# Patient Record
Sex: Male | Born: 1965 | Race: White | Hispanic: No | Marital: Single | Smoking: Former smoker
Health system: Southern US, Community
[De-identification: ages and names within clinical notes are randomized; demographics above are authoritative.]

## PROBLEM LIST (undated history)

## (undated) DIAGNOSIS — M1990 Unspecified osteoarthritis, unspecified site: Secondary | ICD-10-CM

## (undated) DIAGNOSIS — E785 Hyperlipidemia, unspecified: Secondary | ICD-10-CM

## (undated) DIAGNOSIS — R55 Syncope and collapse: Secondary | ICD-10-CM

## (undated) DIAGNOSIS — T7840XA Allergy, unspecified, initial encounter: Secondary | ICD-10-CM

## (undated) DIAGNOSIS — I10 Essential (primary) hypertension: Secondary | ICD-10-CM

## (undated) DIAGNOSIS — J45909 Unspecified asthma, uncomplicated: Secondary | ICD-10-CM

## (undated) DIAGNOSIS — F419 Anxiety disorder, unspecified: Secondary | ICD-10-CM

## (undated) HISTORY — PX: TONSILLECTOMY: SUR1361

## (undated) HISTORY — DX: Unspecified asthma, uncomplicated: J45.909

## (undated) HISTORY — PX: WISDOM TOOTH EXTRACTION: SHX21

## (undated) HISTORY — PX: SPINE SURGERY: SHX786

## (undated) HISTORY — DX: Essential (primary) hypertension: I10

## (undated) HISTORY — DX: Anxiety disorder, unspecified: F41.9

## (undated) HISTORY — DX: Allergy, unspecified, initial encounter: T78.40XA

## (undated) HISTORY — PX: COLONOSCOPY: SHX174

## (undated) HISTORY — DX: Unspecified osteoarthritis, unspecified site: M19.90

## (undated) HISTORY — DX: Hyperlipidemia, unspecified: E78.5

## (undated) HISTORY — PX: CERVICAL SPINE SURGERY: SHX589

---

## 2000-05-16 ENCOUNTER — Ambulatory Visit (HOSPITAL_COMMUNITY): Admission: RE | Admit: 2000-05-16 | Discharge: 2000-05-16 | Payer: Self-pay | Admitting: Internal Medicine

## 2000-05-16 ENCOUNTER — Encounter: Payer: Self-pay | Admitting: Internal Medicine

## 2007-04-13 ENCOUNTER — Ambulatory Visit (HOSPITAL_COMMUNITY): Admission: RE | Admit: 2007-04-13 | Discharge: 2007-04-13 | Payer: Self-pay | Admitting: Sports Medicine

## 2010-10-25 ENCOUNTER — Ambulatory Visit (HOSPITAL_COMMUNITY)
Admission: RE | Admit: 2010-10-25 | Discharge: 2010-10-26 | Payer: Self-pay | Source: Home / Self Care | Attending: Neurosurgery | Admitting: Neurosurgery

## 2011-01-14 LAB — CBC
HCT: 44.2 % (ref 39.0–52.0)
Hemoglobin: 15.3 g/dL (ref 13.0–17.0)
MCH: 31.1 pg (ref 26.0–34.0)
MCHC: 34.6 g/dL (ref 30.0–36.0)
MCV: 89.8 fL (ref 78.0–100.0)
Platelets: 248 10*3/uL (ref 150–400)
RBC: 4.92 MIL/uL (ref 4.22–5.81)
RDW: 13.2 % (ref 11.5–15.5)
WBC: 8.4 10*3/uL (ref 4.0–10.5)

## 2011-01-14 LAB — BASIC METABOLIC PANEL
BUN: 14 mg/dL (ref 6–23)
CO2: 23 mEq/L (ref 19–32)
Calcium: 9.5 mg/dL (ref 8.4–10.5)
Chloride: 109 mEq/L (ref 96–112)
Creatinine, Ser: 1.2 mg/dL (ref 0.4–1.5)
GFR calc Af Amer: >60 mL/min (ref >60)
GFR calc non Af Amer: >60 mL/min (ref >60)
Glucose, Bld: 130 mg/dL — ABNORMAL HIGH (ref 70–99)
Potassium: 3.9 mEq/L (ref 3.5–5.1)
Sodium: 140 mEq/L (ref 135–145)

## 2011-01-14 LAB — SURGICAL PCR SCREEN
MRSA, PCR: NEGATIVE
Staphylococcus aureus: NEGATIVE

## 2012-01-26 ENCOUNTER — Ambulatory Visit (INDEPENDENT_AMBULATORY_CARE_PROVIDER_SITE_OTHER): Payer: BC Managed Care – PPO | Admitting: Physician Assistant

## 2012-01-26 VITALS — BP 134/91 | HR 106 | Temp 98.4°F | Resp 18 | Ht 70.0 in | Wt 211.0 lb

## 2012-01-26 DIAGNOSIS — J069 Acute upper respiratory infection, unspecified: Secondary | ICD-10-CM

## 2012-01-26 MED ORDER — AZITHROMYCIN 250 MG PO TABS: ORAL_TABLET | ORAL | Status: AC

## 2012-01-26 MED ORDER — HYDROCODONE-HOMATROPINE 5-1.5 MG/5ML PO SYRP: 5.0000 mL | ORAL_SOLUTION | ORAL | Status: DC | PRN

## 2012-01-26 MED ORDER — ALBUTEROL SULFATE HFA 108 (90 BASE) MCG/ACT IN AERS: 2.0000 | INHALATION_SPRAY | RESPIRATORY_TRACT | Status: DC | PRN

## 2012-01-26 NOTE — Progress Notes (Signed)
  Subjective:    Patient ID: Joel Salas, male    DOB: 08/14/1966, 46 y.o.   MRN: [id number]  HPI Joel Salas is here c/o 3 weeks of URI symptoms.  Initially head congestion and cough.  Cough persisted.  Now head congestion much worse and cough is dry and frequent.  He can't sleep at night.  Denies SOB or wheezing but does feel tight in the chest.  No fever or chills.  No GI sx.  Using Mucinex.    Former smoker   Review of Systems As above    Objective:   Physical Exam  Constitutional: He appears well-developed and well-nourished.  HENT:  Right Ear: Tympanic membrane normal.  Left Ear: Tympanic membrane normal.  Nose: Mucosal edema and rhinorrhea present.  Mouth/Throat: Posterior oropharyngeal erythema present.  Cardiovascular: Normal rate and regular rhythm.   Pulmonary/Chest: Effort normal and breath sounds normal.  Lymphadenopathy:    He has no cervical adenopathy.    PF 550 Pulse OX 97%      Assessment & Plan:  URI Paroxysmal cough  Zpack, Albuterol, Hycodan.  Has flonase.  Use mucinex DM Push fluids.  Return if sx worsen

## 2012-01-29 ENCOUNTER — Other Ambulatory Visit: Payer: Self-pay | Admitting: Physician Assistant

## 2012-01-30 ENCOUNTER — Other Ambulatory Visit: Payer: Self-pay | Admitting: Physician Assistant

## 2012-01-30 NOTE — Telephone Encounter (Signed)
Pt LM on my VM asking why his RF of Hycodan was declined. Talked with pt and told him RF had been sent in yesterday. D/W pt Mucinex, Abx and inc water intake. Called [facility] and called in Rx - they said there was a Glitch in their system and they hadn't gotten yesterday's

## 2012-03-18 ENCOUNTER — Ambulatory Visit: Payer: BC Managed Care – PPO | Admitting: Internal Medicine

## 2012-05-06 ENCOUNTER — Encounter: Payer: BC Managed Care – PPO | Admitting: Internal Medicine

## 2012-05-26 ENCOUNTER — Other Ambulatory Visit: Payer: Self-pay | Admitting: Internal Medicine

## 2012-07-01 ENCOUNTER — Encounter: Payer: Self-pay | Admitting: Internal Medicine

## 2012-07-01 ENCOUNTER — Ambulatory Visit (INDEPENDENT_AMBULATORY_CARE_PROVIDER_SITE_OTHER): Payer: BC Managed Care – PPO | Admitting: Internal Medicine

## 2012-07-01 VITALS — BP 150/100 | HR 74 | Temp 98.2°F | Resp 16 | Ht 70.0 in | Wt 213.0 lb

## 2012-07-01 DIAGNOSIS — E785 Hyperlipidemia, unspecified: Secondary | ICD-10-CM

## 2012-07-01 DIAGNOSIS — Z0000 Encounter for general adult medical examination without abnormal findings: Secondary | ICD-10-CM

## 2012-07-01 DIAGNOSIS — IMO0001 Reserved for inherently not codable concepts without codable children: Secondary | ICD-10-CM

## 2012-07-01 DIAGNOSIS — Z6830 Body mass index (BMI) 30.0-30.9, adult: Secondary | ICD-10-CM

## 2012-07-01 LAB — CBC WITH DIFFERENTIAL/PLATELET
Basophils Absolute: 0 10*3/uL (ref 0.0–0.1)
Basophils Relative: 1 % (ref 0–1)
Eosinophils Absolute: 0.1 10*3/uL (ref 0.0–0.7)
Eosinophils Relative: 1 % (ref 0–5)
HCT: 44.3 % (ref 39.0–52.0)
Hemoglobin: 16.1 g/dL (ref 13.0–17.0)
Lymphocytes Relative: 40 % (ref 12–46)
Lymphs Abs: 3.1 10*3/uL (ref 0.7–4.0)
MCH: 30.8 pg (ref 26.0–34.0)
MCHC: 36.3 g/dL — ABNORMAL HIGH (ref 30.0–36.0)
MCV: 84.7 fL (ref 78.0–100.0)
Monocytes Absolute: 0.6 10*3/uL (ref 0.1–1.0)
Monocytes Relative: 7 % (ref 3–12)
Neutro Abs: 4 10*3/uL (ref 1.7–7.7)
Neutrophils Relative %: 51 % (ref 43–77)
Platelets: 239 10*3/uL (ref 150–400)
RBC: 5.23 MIL/uL (ref 4.22–5.81)
RDW: 13.2 % (ref 11.5–15.5)
WBC: 7.7 10*3/uL (ref 4.0–10.5)

## 2012-07-01 MED ORDER — FLUTICASONE PROPIONATE 50 MCG/ACT NA SUSP: 1.0000 | Freq: Every day | NASAL | Status: DC

## 2012-07-01 MED ORDER — SIMVASTATIN 40 MG PO TABS: 40.0000 mg | ORAL_TABLET | Freq: Every evening | ORAL | Status: DC

## 2012-07-01 MED ORDER — ALBUTEROL SULFATE HFA 108 (90 BASE) MCG/ACT IN AERS: 2.0000 | INHALATION_SPRAY | Freq: Four times a day (QID) | RESPIRATORY_TRACT | Status: DC | PRN

## 2012-07-01 NOTE — Progress Notes (Signed)
  Subjective:    Patient ID: Joel Salas, male    DOB: [birth year], 46 y.o.   MRN: [id number]  HPI    Review of Systems  Constitutional: Positive for fatigue.  HENT: Positive for dental problem, postnasal drip and tinnitus.   Respiratory: Positive for shortness of breath and wheezing.   Musculoskeletal: Positive for back pain.  Neurological: Positive for dizziness and light-headedness.  Hematological: Negative.   Psychiatric/Behavioral: Negative.        Objective:   Physical Exam        Assessment & Plan:

## 2012-07-02 ENCOUNTER — Other Ambulatory Visit: Payer: Self-pay | Admitting: Physician Assistant

## 2012-07-02 LAB — COMPREHENSIVE METABOLIC PANEL
ALT: 29 U/L (ref 0–53)
AST: 21 U/L (ref 0–37)
Albumin: 4.7 g/dL (ref 3.5–5.2)
Alkaline Phosphatase: 40 U/L (ref 39–117)
BUN: 15 mg/dL (ref 6–23)
CO2: 23 mEq/L (ref 19–32)
Calcium: 9.5 mg/dL (ref 8.4–10.5)
Chloride: 110 mEq/L (ref 96–112)
Creat: 1.13 mg/dL (ref 0.50–1.35)
Glucose, Bld: 99 mg/dL (ref 70–99)
Potassium: 4.1 mEq/L (ref 3.5–5.3)
Sodium: 142 mEq/L (ref 135–145)
Total Bilirubin: 0.9 mg/dL (ref 0.3–1.2)
Total Protein: 6.8 g/dL (ref 6.0–8.3)

## 2012-07-02 LAB — LIPID PANEL
Cholesterol: 167 mg/dL (ref 0–200)
HDL: 34 mg/dL — ABNORMAL LOW (ref >39)
LDL Cholesterol: 121 mg/dL — ABNORMAL HIGH (ref 0–99)
Total CHOL/HDL Ratio: 4.9 Ratio
Triglycerides: 62 mg/dL (ref <150)
VLDL: 12 mg/dL (ref 0–40)

## 2012-07-02 NOTE — Progress Notes (Signed)
Subjective:    Patient ID: Joel Salas, male    DOB: 01-06-1966, 46 y.o.   MRN: [id number]  HPICPE Patient Active Problem List  Diagnosis  . Hyperlipidemia  . Elevated BP  . 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 His career/Hopes to resume riding this year for fun Immunizations up to date with flu shot unavailable today   Had a recent episode of bronchitis which led to wheezing/albuterol was helpful and he would like a refill Past history has no asthma but a few episodes of disease associated bronchospasm He has a history of allergic rhinitis Review of Systems 13 point review of systems negative    Objective:   Physical Exam Blood pressure 150/100 but record of home blood pressures reviewed and all are below 135/85 He does this several times a week HEENT clear Heart regular without murmur Lungs clear Abdomen benign Extremities clear Neurological intact Skin clear  Results for orders placed in visit on 07/01/12  CBC WITH DIFFERENTIAL      Component Value Range   WBC 7.7  4.0 - 10.5 K/uL   RBC 5.23  4.22 - 5.81 MIL/uL   Hemoglobin 16.1  13.0 - 17.0 g/dL   HCT 40.9  81.1 - 91.4 %   MCV 84.7  78.0 - 100.0 fL   MCH 30.8  26.0 - 34.0 pg   MCHC 36.3 (*) 30.0 - 36.0 g/dL   RDW 78.2  95.6 - 21.3 %   Platelets 239  150 - 400 K/uL   Neutrophils Relative 51  43 - 77 %   Neutro Abs 4.0  1.7 - 7.7 K/uL   Lymphocytes Relative 40  12 - 46 %   Lymphs Abs 3.1  0.7 - 4.0 K/uL   Monocytes Relative 7  3 - 12 %   Monocytes Absolute 0.6  0.1 - 1.0 K/uL   Eosinophils Relative 1  0 - 5 %   Eosinophils Absolute 0.1  0.0 - 0.7 K/uL   Basophils Relative 1  0 - 1 %   Basophils Absolute 0.0  0.0 - 0.1 K/uL   Smear Review Criteria  for review not met    LIPID PANEL      Component Value Range   Cholesterol 167  0 - 200 mg/dL   Triglycerides 62  <086 mg/dL   HDL 34 (*) >57 mg/dL   Total CHOL/HDL Ratio 4.9     VLDL 12  0 - 40 mg/dL   LDL Cholesterol 846 (*) 0 - 99 mg/dL  COMPREHENSIVE METABOLIC PANEL      Component Value Range   Sodium 142  135 - 145 mEq/L   Potassium 4.1  3.5 - 5.3 mEq/L   Chloride 110  96 - 112 mEq/L   CO2 23  19 - 32 mEq/L   Glucose, Bld 99  70 - 99 mg/dL   BUN 15  6 - 23 mg/dL   Creat 9.62  9.52 - 8.41 mg/dL   Total Bilirubin 0.9  0.3 - 1.2 mg/dL   Alkaline Phosphatase 40  39 - 117 U/L   AST 21  0 - 37 U/L   ALT 29  0 - 53 U/L   Total Protein 6.8  6.0 - 8.3 g/dL   Albumin  4.7  3.5 - 5.2 g/dL   Calcium 9.5  8.4 - 81.1 mg/dL        Assessment & Plan:  Annual physical examination 1. Hyperlipidemia  CBC with Differential, Lipid panel, Comprehensive metabolic panel  2. Elevated BP    3. BMI 30.0-30.9,adult    4   Allergic rhinitis 5.  History of respiratory illness induced bronchospasm  Meds ordered this encounter  Medications  . simvastatin (ZOCOR) 40 MG tablet    Sig: Take 1 tablet (40 mg total) by mouth every evening.    Dispense:  90 tablet    Refill:  3  . fluticasone (FLONASE) 50 MCG/ACT nasal spray    Sig: Place 1 spray into the nose daily.    Dispense:  16 g    Refill:  11  . albuterol (PROVENTIL HFA;VENTOLIN HFA) 108 (90 BASE) MCG/ACT inhaler    Sig: Inhale 2 puffs into the lungs every 6 (six) hours as needed for wheezing.    Dispense:  1 Inhaler    Refill:  5    Discussed weight loss

## 2012-07-02 NOTE — Telephone Encounter (Signed)
?   Ok x 3, just seen yesterday for wheezing, but note has not been completed yet.

## 2012-07-03 ENCOUNTER — Encounter: Payer: Self-pay | Admitting: Internal Medicine

## 2013-08-28 ENCOUNTER — Other Ambulatory Visit: Payer: Self-pay | Admitting: Physician Assistant

## 2013-09-29 ENCOUNTER — Ambulatory Visit (INDEPENDENT_AMBULATORY_CARE_PROVIDER_SITE_OTHER): Payer: BC Managed Care – PPO | Admitting: Internal Medicine

## 2013-09-29 ENCOUNTER — Encounter: Payer: Self-pay | Admitting: Internal Medicine

## 2013-09-29 VITALS — BP 150/98 | HR 75 | Temp 98.5°F | Resp 16 | Ht 69.5 in | Wt 202.0 lb

## 2013-09-29 DIAGNOSIS — IMO0001 Reserved for inherently not codable concepts without codable children: Secondary | ICD-10-CM

## 2013-09-29 DIAGNOSIS — Z0000 Encounter for general adult medical examination without abnormal findings: Secondary | ICD-10-CM

## 2013-09-29 DIAGNOSIS — Z9889 Other specified postprocedural states: Secondary | ICD-10-CM

## 2013-09-29 DIAGNOSIS — Z23 Encounter for immunization: Secondary | ICD-10-CM

## 2013-09-29 DIAGNOSIS — J309 Allergic rhinitis, unspecified: Secondary | ICD-10-CM

## 2013-09-29 DIAGNOSIS — J45909 Unspecified asthma, uncomplicated: Secondary | ICD-10-CM

## 2013-09-29 DIAGNOSIS — M549 Dorsalgia, unspecified: Secondary | ICD-10-CM

## 2013-09-29 DIAGNOSIS — E785 Hyperlipidemia, unspecified: Secondary | ICD-10-CM

## 2013-09-29 DIAGNOSIS — M25551 Pain in right hip: Secondary | ICD-10-CM

## 2013-09-29 DIAGNOSIS — M542 Cervicalgia: Secondary | ICD-10-CM

## 2013-09-29 LAB — CBC WITH DIFFERENTIAL/PLATELET
Basophils Absolute: 0 10*3/uL (ref 0.0–0.1)
Basophils Relative: 1 % (ref 0–1)
Eosinophils Absolute: 0.2 10*3/uL (ref 0.0–0.7)
Eosinophils Relative: 2 % (ref 0–5)
HCT: 45 % (ref 39.0–52.0)
Hemoglobin: 15.9 g/dL (ref 13.0–17.0)
Lymphocytes Relative: 45 % (ref 12–46)
Lymphs Abs: 3.5 10*3/uL (ref 0.7–4.0)
MCH: 30.5 pg (ref 26.0–34.0)
MCHC: 35.3 g/dL (ref 30.0–36.0)
MCV: 86.2 fL (ref 78.0–100.0)
Monocytes Absolute: 0.6 10*3/uL (ref 0.1–1.0)
Monocytes Relative: 8 % (ref 3–12)
Neutro Abs: 3.4 10*3/uL (ref 1.7–7.7)
Neutrophils Relative %: 44 % (ref 43–77)
Platelets: 251 10*3/uL (ref 150–400)
RBC: 5.22 MIL/uL (ref 4.22–5.81)
RDW: 14 % (ref 11.5–15.5)
WBC: 7.8 10*3/uL (ref 4.0–10.5)

## 2013-09-29 LAB — POCT URINALYSIS DIPSTICK
Bilirubin, UA: NEGATIVE
Blood, UA: NEGATIVE
Glucose, UA: NEGATIVE
Ketones, UA: NEGATIVE
Leukocytes, UA: NEGATIVE
Nitrite, UA: NEGATIVE
Protein, UA: NEGATIVE
Spec Grav, UA: <=1.005
Urobilinogen, UA: 0.2
pH, UA: 5.5

## 2013-09-29 LAB — COMPREHENSIVE METABOLIC PANEL
ALT: 39 U/L (ref 0–53)
AST: 31 U/L (ref 0–37)
Albumin: 4.6 g/dL (ref 3.5–5.2)
Alkaline Phosphatase: 48 U/L (ref 39–117)
BUN: 22 mg/dL (ref 6–23)
CO2: 23 mEq/L (ref 19–32)
Calcium: 9.5 mg/dL (ref 8.4–10.5)
Chloride: 110 mEq/L (ref 96–112)
Creat: 1.27 mg/dL (ref 0.50–1.35)
Glucose, Bld: 107 mg/dL — ABNORMAL HIGH (ref 70–99)
Potassium: 4.2 mEq/L (ref 3.5–5.3)
Sodium: 141 mEq/L (ref 135–145)
Total Bilirubin: 0.6 mg/dL (ref 0.3–1.2)
Total Protein: 6.7 g/dL (ref 6.0–8.3)

## 2013-09-29 LAB — LIPID PANEL
Cholesterol: 176 mg/dL (ref 0–200)
HDL: 34 mg/dL — ABNORMAL LOW (ref >39)
LDL Cholesterol: 119 mg/dL — ABNORMAL HIGH (ref 0–99)
Total CHOL/HDL Ratio: 5.2 Ratio
Triglycerides: 116 mg/dL (ref <150)
VLDL: 23 mg/dL (ref 0–40)

## 2013-09-29 LAB — PSA: PSA: 1.07 ng/mL (ref <=4.00)

## 2013-09-29 MED ORDER — FLUTICASONE PROPIONATE 50 MCG/ACT NA SUSP: 1.0000 | Freq: Every day | NASAL | Status: DC

## 2013-09-29 MED ORDER — ALBUTEROL SULFATE HFA 108 (90 BASE) MCG/ACT IN AERS: INHALATION_SPRAY | RESPIRATORY_TRACT | Status: DC

## 2013-09-29 MED ORDER — SIMVASTATIN 40 MG PO TABS: 40.0000 mg | ORAL_TABLET | Freq: Every evening | ORAL | Status: DC

## 2013-09-29 NOTE — Patient Instructions (Signed)
Referral to Dr Ellamae Sia for PT Eval and Treat for chronic low back spasm (s/p neck surgery 3 y ago)

## 2013-09-29 NOTE — Progress Notes (Signed)
Subjective:    Patient ID: Joel Salas, male    DOB: 1965-11-22, 47 y.o.   MRN: [id number]  HPIcpe Patient Active Problem List   Diagnosis Date Noted  . Hyperlipidemia--no chg wt/diet 07/01/2012  . Elevated BP---dx htn '08 treated til hypotens 2013 off since with good home BP 07/01/2012  . BMI 30.0-30.9,adult 07/01/2012    -  AR w/ seasonal RAD  Current outpatient prescriptions:fluticasone (FLONASE) 50 MCG/ACT nasal spray, Place 1 spray into the nose daily. simvastatin (ZOCOR) 40 MG tablet, Take 1 tablet (40 mg total) by mouth every evening., Disp: 90 tablet, Rfl: 3;   VENTOLIN HFA 108 (90 BASE) MCG/ACT inhaler, INHALE 2 PUFFS INTO THE LUNGS EVERY 4 HOURS AS NEEDED FOR WHEEZING, Disp: 1 Inhaler, Rfl: 2///seasonal  Doing well except aches and pains Low back acts up r side w/ activ---can no longer bike Im utd  FH-arth/BP/choles rel to diet  SH-welder/own business  [place] fam  Review of Systems  Constitutional: Negative.   HENT: Negative.   Eyes: Negative.   Respiratory: Positive for wheezing.   Cardiovascular: Negative.   Gastrointestinal: Positive for nausea.  Endocrine: Negative.   Genitourinary: Negative.  Negative for frequency.  Musculoskeletal: Positive for arthralgias and back pain.  Allergic/Immunologic: Positive for environmental allergies.  Neurological: Negative.  Negative for headaches.  Hematological: Negative.   Psychiatric/Behavioral: Negative.  Negative for behavioral problems, confusion, sleep disturbance, dysphoric mood and decreased concentration. The patient is not nervous/anxious.        Objective:   Physical Exam  Constitutional: He is oriented to person, place, and time. He appears well-developed and well-nourished.  HENT:  Head: Normocephalic.  Right Ear: External ear normal.  Left Ear: External ear normal.  Nose: Nose normal.  Mouth/Throat: Oropharynx is clear and moist.  Tms and canals clear  Eyes: Conjunctivae and EOM are  normal. Pupils are equal, round, and reactive to light.  Neck: Normal range of motion. Neck supple. No thyromegaly present.  Good rom post surg  Cardiovascular: Normal rate, regular rhythm, normal heart sounds and intact distal pulses.   No murmur heard. Pulmonary/Chest: Effort normal and breath sounds normal. No respiratory distress. He has no wheezes. He has no rales.  Abdominal: Soft. Bowel sounds are normal. He exhibits no distension and no mass. There is no tenderness. There is no rebound and no guarding.  No hepatosplenomegaly  Musculoskeletal: Normal range of motion. He exhibits tenderness. He exhibits no edema.  Tender R lumbar muscles w/ neg SLR 90  Lymphadenopathy:    He has no cervical adenopathy.  Neurological: He is alert and oriented to person, place, and time. He has normal reflexes. No cranial nerve deficit. He exhibits normal muscle tone. Coordination normal.  Skin: Skin is warm and dry. No rash noted.  Psychiatric: He has a normal mood and affect. His behavior is normal. Judgment and thought content normal.          Assessment & Plan:  Need for prophylactic vaccination and inoculation against influenza - Plan: Flu Vaccine QUAD 36+ mos IM  Routine general medical examination at a health care facility - Plan: POCT urinalysis dipstick, CBC with Differential, Comprehensive metabolic panel, Lipid panel, PSA  Back pain Ref J O'Halloran for pt Hip pain, right  Elevated BP-off meds due to low BP///to follow home BP and call if >135/85  AR (allergic rhinitis) - Plan: fluticasone (FLONASE) 50 MCG/ACT nasal spray  RAD (reactive airway disease) - Plan: albuterol (VENTOLIN HFA) 108 (90 BASE) MCG/ACT  inhaler  Other and unspecified hyperlipidemia - Plan: simvastatin (ZOCOR) 40 MG tablet  H/O cervical spine surgery-2011 w/ occas Neck pain---cont flex ex  Out of zocor 1 month so labs will assess needs  Meds ordered this encounter  Medications  . fluticasone (FLONASE) 50  MCG/ACT nasal spray    Sig: Place 1 spray into both nostrils daily.    Dispense:  16 g    Refill:  11  . albuterol (VENTOLIN HFA) 108 (90 BASE) MCG/ACT inhaler    Sig: INHALE 2 PUFFS INTO THE LUNGS EVERY 4 HOURS AS NEEDED FOR WHEEZING    Dispense:  1 Inhaler    Refill:  5  . simvastatin (ZOCOR) 40 MG tablet    Sig: Take 1 tablet (40 mg total) by mouth every evening.    Dispense:  90 tablet    Refill:  3       May change if labs dictate

## 2013-09-30 ENCOUNTER — Encounter: Payer: Self-pay | Admitting: Internal Medicine

## 2014-01-28 ENCOUNTER — Ambulatory Visit (INDEPENDENT_AMBULATORY_CARE_PROVIDER_SITE_OTHER): Payer: BC Managed Care – PPO | Admitting: Internal Medicine

## 2014-01-28 VITALS — BP 160/92 | HR 88 | Temp 98.0°F | Resp 16 | Ht 70.0 in | Wt 219.6 lb

## 2014-01-28 DIAGNOSIS — G4700 Insomnia, unspecified: Secondary | ICD-10-CM

## 2014-01-28 DIAGNOSIS — F411 Generalized anxiety disorder: Secondary | ICD-10-CM

## 2014-01-28 DIAGNOSIS — R110 Nausea: Secondary | ICD-10-CM

## 2014-01-28 MED ORDER — PAROXETINE HCL 20 MG PO TABS: 20.0000 mg | ORAL_TABLET | Freq: Every day | ORAL | Status: DC

## 2014-01-28 MED ORDER — ALPRAZOLAM 0.5 MG PO TABS: 0.5000 mg | ORAL_TABLET | Freq: Two times a day (BID) | ORAL | Status: DC | PRN

## 2014-01-28 NOTE — Progress Notes (Signed)
Subjective:    Patient ID: Joel Salas, male    DOB: [birth year], 48 y.o.   MRN: [id number]  HPI This chart was scribed for [facility], by Lovena Le Stclair Szymborski, Scribe. This patient was seen in room 5 and the patient's care was started at 11:43 AM.  HPI Comments: Joel Salas is a 48 y.o. male who presents to the [facility] for nausea and anxiety. He reports has had ongoing nausea and anxiety flare ups which seem to be associated with one another. He reports these flare ups occur sometimes sporadically and occur suddenly.  He states while at the dinner table, began having paraesthesias in his finger tips and having a mild panic attack. He notices that his nausea sometimes flares up just before he gets a panic attack. He reports these episodes have occurred while watching a basketball game in the stands, at dinner eating a hot dog and drinking beer on [place]. He denies any sx of heartburn.   He also reports having increased stress lately with family members who are ill. He states his brother is in renal failure as well his mother and sister who are ill. He states that financially he is stressed right now. He states often will wake up during the night 3 or 4 times per week and that it takes up to an hour to fall back asleep.   Patient Active Problem List   Diagnosis Date Noted  . H/O cervical spine surgery-2011 09/29/2013  . Hyperlipidemia 07/01/2012  . Elevated BP 07/01/2012  . BMI 30.0-30.9,adult 07/01/2012     No Known Allergies   Review of Systems  Constitutional: Negative for fever and chills.  Respiratory: Negative for cough and shortness of breath.   Cardiovascular: Negative for chest pain.  Gastrointestinal: Negative for abdominal pain.  Musculoskeletal: Negative for back pain.  Psychiatric/Behavioral: The patient is nervous/anxious.       Objective:   Physical Exam  Nursing note and vitals reviewed. Constitutional: He is oriented  to person, place, and time. He appears well-developed and well-nourished. No distress.  HENT:  Head: Normocephalic and atraumatic.  Eyes: Conjunctivae are normal. Right eye exhibits no discharge. Left eye exhibits no discharge.  Neck: Normal range of motion. No thyromegaly present.  Cardiovascular: Normal rate.   Pulmonary/Chest: Effort normal. No respiratory distress.  Musculoskeletal: Normal range of motion. He exhibits no edema.  Neurological: He is alert and oriented to person, place, and time.  Skin: Skin is warm and dry.  Psychiatric: He has a normal mood and affect. Thought content normal.   Triage Vitals: BP 160/92  Pulse 88  Temp(Src) 98 F (36.7 C) (Oral)  Resp 16  Ht $R'5\' 10"'mT$  (1.778 m)  Wt 219 lb 9.6 oz (99.61 kg)  BMI 31.51 kg/m2  SpO2 99%  Results for orders placed in visit on 09/29/13  CBC WITH DIFFERENTIAL      Result Value Ref Range   WBC 7.8  4.0 - 10.5 K/uL   RBC 5.22  4.22 - 5.81 MIL/uL   Hemoglobin 15.9  13.0 - 17.0 g/dL   HCT 45.0  39.0 - 52.0 %   MCV 86.2  78.0 - 100.0 fL   MCH 30.5  26.0 - 34.0 pg   MCHC 35.3  30.0 - 36.0 g/dL   RDW 14.0  11.5 - 15.5 %   Platelets 251  150 - 400 K/uL   Neutrophils Relative % 44  43 - 77 %  Neutro Abs 3.4  1.7 - 7.7 K/uL   Lymphocytes Relative 45  12 - 46 %   Lymphs Abs 3.5  0.7 - 4.0 K/uL   Monocytes Relative 8  3 - 12 %   Monocytes Absolute 0.6  0.1 - 1.0 K/uL   Eosinophils Relative 2  0 - 5 %   Eosinophils Absolute 0.2  0.0 - 0.7 K/uL   Basophils Relative 1  0 - 1 %   Basophils Absolute 0.0  0.0 - 0.1 K/uL   Smear Review Criteria for review not met    COMPREHENSIVE METABOLIC PANEL      Result Value Ref Range   Sodium 141  135 - 145 mEq/L   Potassium 4.2  3.5 - 5.3 mEq/L   Chloride 110  96 - 112 mEq/L   CO2 23  19 - 32 mEq/L   Glucose, Bld 107 (*) 70 - 99 mg/dL   BUN 22  6 - 23 mg/dL   Creat 1.27  0.50 - 1.35 mg/dL   Total Bilirubin 0.6  0.3 - 1.2 mg/dL   Alkaline Phosphatase 48  39 - 117 U/L   AST 31  0  - 37 U/L   ALT 39  0 - 53 U/L   Total Protein 6.7  6.0 - 8.3 g/dL   Albumin 4.6  3.5 - 5.2 g/dL   Calcium 9.5  8.4 - 10.5 mg/dL  LIPID PANEL      Result Value Ref Range   Cholesterol 176  0 - 200 mg/dL   Triglycerides 116  <150 mg/dL   HDL 34 (*) >39 mg/dL   Total CHOL/HDL Ratio 5.2     VLDL 23  0 - 40 mg/dL   LDL Cholesterol 119 (*) 0 - 99 mg/dL  PSA      Result Value Ref Range   PSA 1.07  <=4.00 ng/mL  POCT URINALYSIS DIPSTICK      Result Value Ref Range   Color, UA lt yellow     Clarity, UA clear     Glucose, UA neg     Bilirubin, UA neg     Ketones, UA neg     Spec Grav, UA <=1.005     Blood, UA neg     pH, UA 5.5     Protein, UA neg     Urobilinogen, UA 0.2     Nitrite, UA neg     Leukocytes, UA Negative        Assessment & Plan:  DIAGNOSTIC STUDIES: Oxygen Saturation is 99% on room air, normal by my interpretation.    COORDINATION OF CARE: At 1140 AM Discussed treatment plan with patient which includes anxiety medicine. Patient agrees.   I personally performed the services described in this documentation, which was scribed in my presence. The recorded information has been reviewed and is accurate. I have completed the patient encounter in its entirety as documented by the scribe, with editing by me where necessary. Robert P. Laney Pastor, M.D.  Nausea alone  Generalized anxiety disorder  Insomnia, unspecified  Meds ordered this encounter  Medications  . ALPRAZolam (XANAX) 0.5 MG tablet    Sig: Take 1 tablet (0.5 mg total) by mouth 2 (two) times daily as needed for anxiety.    Dispense:  30 tablet    Refill:  1  . PARoxetine (PAXIL) 20 MG tablet    Sig: Take 1 tablet (20 mg total) by mouth daily.    Dispense:  30 tablet  Refill:  1   F/u 1-2 mos

## 2014-02-04 NOTE — Progress Notes (Signed)
Appointment made for 03/02/14 @ 3:15pm.

## 2014-03-02 ENCOUNTER — Encounter: Payer: Self-pay | Admitting: Internal Medicine

## 2014-03-02 ENCOUNTER — Ambulatory Visit (INDEPENDENT_AMBULATORY_CARE_PROVIDER_SITE_OTHER): Payer: BC Managed Care – PPO | Admitting: Internal Medicine

## 2014-03-02 VITALS — BP 120/79 | HR 70 | Temp 97.0°F | Resp 18 | Ht 70.0 in | Wt 211.0 lb

## 2014-03-02 DIAGNOSIS — IMO0001 Reserved for inherently not codable concepts without codable children: Secondary | ICD-10-CM

## 2014-03-02 DIAGNOSIS — G4700 Insomnia, unspecified: Secondary | ICD-10-CM

## 2014-03-02 DIAGNOSIS — R030 Elevated blood-pressure reading, without diagnosis of hypertension: Secondary | ICD-10-CM

## 2014-03-02 DIAGNOSIS — F411 Generalized anxiety disorder: Secondary | ICD-10-CM

## 2014-03-02 MED ORDER — PAROXETINE HCL 20 MG PO TABS: 20.0000 mg | ORAL_TABLET | Freq: Every day | ORAL | Status: DC

## 2014-03-02 NOTE — Progress Notes (Signed)
Subjective:    Patient ID: Joel Salas, male    DOB: 03-16-1966, 48 y.o.   MRN: [id number] This chart was scribed for Tami Lin, MD by Anastasia Pall, ED Scribe. This patient was seen in room 21 and the patient's care was started at 3:32 PM.  Chief Complaint  Patient presents with  . Follow-up    medicine xanax paxil   HPI Joel Salas is a 48 y.o. male who presents to the [facility] for a follow up of Xanax and Paxil.   He reports responding well to his medications. He denies any side effects with taking Paxil. He reports a few episodes where he needed to take his Xanax, but reports relief after taking his dose. He reports being healthy, and denies any symptoms.  ANX desolved and BP went down!  Patient Active Problem List   Diagnosis Date Noted  . H/O cervical spine surgery-2011 09/29/2013  . Hyperlipidemia 07/01/2012  . Elevated BP 07/01/2012  . BMI 30.0-30.9,adult 07/01/2012   Prior to Admission medications   Medication Sig Start Date End Date Taking? Authorizing Provider  albuterol (VENTOLIN HFA) 108 (90 BASE) MCG/ACT inhaler INHALE 2 PUFFS INTO THE LUNGS EVERY 4 HOURS AS NEEDED FOR WHEEZING 09/29/13  Yes Leandrew Koyanagi, MD  ALPRAZolam Duanne Moron) 0.5 MG tablet Take 1 tablet (0.5 mg total) by mouth 2 (two) times daily as needed for anxiety. 01/28/14  Yes Leandrew Koyanagi, MD  fluticasone (FLONASE) 50 MCG/ACT nasal spray Place 1 spray into both nostrils daily. 09/29/13  Yes Leandrew Koyanagi, MD  PARoxetine (PAXIL) 20 MG tablet Take 1 tablet (20 mg total) by mouth daily. 01/28/14  Yes Leandrew Koyanagi, MD  simvastatin (ZOCOR) 40 MG tablet Take 1 tablet (40 mg total) by mouth every evening. 09/29/13  Yes Leandrew Koyanagi, MD  HYDROcodone-homatropine [facility]) 5-1.5 MG/5ML syrup TAKE 1 TEASPOONFUL BY MOUTH EVERY 4 HOURS AS NEEDED FOR COUGH 02/20/61   Beatriz Chancellor, PA-C   Review of Systems  Constitutional: Negative for fever.  HENT: Negative for congestion.     Respiratory: Negative for cough.   Cardiovascular: Negative for chest pain and palpitations.  Gastrointestinal: Negative for nausea, vomiting and abdominal pain.  Musculoskeletal: Negative for arthralgias and myalgias.  Skin: Negative for wound.  Psychiatric/Behavioral: Negative for dysphoric mood. The patient is not nervous/anxious.       Objective:   Physical Exam  Nursing note and vitals reviewed. Constitutional: He is oriented to person, place, and time. He appears well-developed and well-nourished. No distress.  HENT:  Head: Normocephalic and atraumatic.  Eyes: EOM are normal.  Neck: Neck supple.  Cardiovascular: Normal rate.   Pulmonary/Chest: Effort normal. No respiratory distress.  Musculoskeletal: Normal range of motion.  Neurological: He is alert and oriented to person, place, and time.  Skin: Skin is warm and dry.  Psychiatric: He has a normal mood and affect. His behavior is normal. His mood appears not anxious.   BP 120/79  Pulse 70  Temp(Src) 97 F (36.1 C) (Oral)  Resp 18  Ht 5\' 10"  (1.778 m)  Wt 211 lb (95.709 kg)  BMI 30.28 kg/m2  SpO2 96%     Assessment & Plan:  Insom/GAD Meds ordered this encounter  Medications  . PARoxetine (PAXIL) 20 MG tablet    Sig: Take 1 tablet (20 mg total) by mouth daily.    Dispense:  90 tablet    Refill:  3   F/u 1 yr --call fro ref if  needed   I have completed the patient encounter in its entirety as documented by the scribe, with editing by me where necessary. Eastyn Dattilo P. Laney Pastor, M.D.

## 2014-03-18 ENCOUNTER — Other Ambulatory Visit: Payer: Self-pay | Admitting: Internal Medicine

## 2014-03-21 NOTE — Telephone Encounter (Signed)
Called in.

## 2014-05-25 ENCOUNTER — Other Ambulatory Visit: Payer: Self-pay | Admitting: Internal Medicine

## 2014-05-26 NOTE — Telephone Encounter (Signed)
Faxed

## 2014-05-27 ENCOUNTER — Telehealth: Payer: Self-pay

## 2014-05-27 NOTE — Telephone Encounter (Signed)
Pt needs a refill of xanax please!

## 2014-05-30 NOTE — Telephone Encounter (Signed)
Notified pt in case he wasn't aware that xanax had been faxed to pharm the day before his call. He was not aware, but will check w/pharm and call back if they do not have it.

## 2014-06-27 ENCOUNTER — Other Ambulatory Visit: Payer: Self-pay | Admitting: Physician Assistant

## 2014-06-28 NOTE — Telephone Encounter (Signed)
Faxed

## 2014-07-12 ENCOUNTER — Telehealth: Payer: Self-pay

## 2014-07-12 NOTE — Telephone Encounter (Signed)
Advised pt to schedule an appt or come to the walk in clinic to have bloodwork/follow up to decide if discontinuing cholesterol medication is appropriate

## 2014-07-12 NOTE — Telephone Encounter (Signed)
Pt of Dr. Laney Pastor states that he was told that if he lost 15-20 ibs he could go off of his cholesterol medication. Pt states that he has lost the weight and would like to know if Dr. Laney Pastor would give him the ok to discontinue his medication. Please advise pt.

## 2014-08-03 ENCOUNTER — Ambulatory Visit (INDEPENDENT_AMBULATORY_CARE_PROVIDER_SITE_OTHER): Payer: BC Managed Care – PPO | Admitting: Internal Medicine

## 2014-08-03 ENCOUNTER — Encounter: Payer: Self-pay | Admitting: Internal Medicine

## 2014-08-03 VITALS — BP 124/78 | HR 66 | Temp 98.5°F | Resp 16 | Ht 70.0 in | Wt 198.0 lb

## 2014-08-03 DIAGNOSIS — E785 Hyperlipidemia, unspecified: Secondary | ICD-10-CM

## 2014-08-03 DIAGNOSIS — Z23 Encounter for immunization: Secondary | ICD-10-CM

## 2014-08-03 LAB — LIPID PANEL
Cholesterol: 186 mg/dL (ref 0–200)
HDL: 46 mg/dL (ref >39)
LDL Cholesterol: 115 mg/dL — ABNORMAL HIGH (ref 0–99)
Total CHOL/HDL Ratio: 4 Ratio
Triglycerides: 123 mg/dL (ref <150)
VLDL: 25 mg/dL (ref 0–40)

## 2014-08-03 MED ORDER — ALPRAZOLAM 0.5 MG PO TABS: ORAL_TABLET | ORAL | Status: DC

## 2014-08-03 NOTE — Progress Notes (Signed)
Subjective:  This chart was scribed for Tami Lin, MD by Donato Schultz, Medical Scribe. This patient was seen in Room 24 and the patient's care was started at 1:46 PM.   Patient ID: Joel Salas, male    DOB: [birth year], 48 y.o.   MRN: [id number]  HPI HPI Comments: Joel Salas is a 48 y.o. male with a history of hyperlipidemia who presents to the [facility] to check his cholesterol.  He is taking simvastatin 40mg .  His anxiety is under control but he needs a refill of his Xanax and Paxil.  He [place] the woods daily.    Patient Active Problem List   Diagnosis Date Noted  . H/O cervical spine surgery-2011 09/29/2013  . Hyperlipidemia 07/01/2012  . Elevated BP 07/01/2012  . BMI 30.0-30.9,adult 07/01/2012   Past Medical History  Diagnosis Date  . Anxiety   . Allergy   . Arthritis    Past Surgical History  Procedure Laterality Date  . Spine surgery     No Known Allergies Prior to Admission medications   Medication Sig Start Date End Date Taking? Authorizing Provider  albuterol (VENTOLIN HFA) 108 (90 BASE) MCG/ACT inhaler INHALE 2 PUFFS INTO THE LUNGS EVERY 4 HOURS AS NEEDED FOR WHEEZING 09/29/13   Leandrew Koyanagi, MD  ALPRAZolam Duanne Moron) 0.5 MG tablet TAKE 1 TABLET BY MOUTH TWICE DAILY AS NEEDED FOR ANXIETY 06/27/14   Leandrew Koyanagi, MD  fluticasone (FLONASE) 50 MCG/ACT nasal spray Place 1 spray into both nostrils daily. 09/29/13   Leandrew Koyanagi, MD  HYDROcodone-homatropine [facility]) 5-1.5 MG/5ML syrup TAKE 1 TEASPOONFUL BY MOUTH EVERY 4 HOURS AS NEEDED FOR COUGH 2/72/53   Beatriz Chancellor, PA-C  PARoxetine (PAXIL) 20 MG tablet Take 1 tablet (20 mg total) by mouth daily. 03/02/14   Leandrew Koyanagi, MD  simvastatin (ZOCOR) 40 MG tablet Take 1 tablet (40 mg total) by mouth every evening. 09/29/13   Leandrew Koyanagi, MD   History   Social History  . Marital Status: Single    Spouse Name: N/A    Number of Children: N/A    . Years of Education: N/A   Occupational History  . Equities trader    Social History Main Topics  . Smoking status: Former Research scientist (life sciences)  . Smokeless tobacco: Not on file  . Alcohol Use: Yes     Comment: 12-15  . Drug Use: No  . Sexual Activity: Not on file   Other Topics Concern  . Not on file   Social History Narrative   Single. Education: college.    Review of Systems noncontr  Objective:  Physical Exam  Nursing note and vitals reviewed. Constitutional: He is oriented to person, place, and time. He appears well-developed and well-nourished. No distress.  HENT:  Head: Normocephalic and atraumatic.  Eyes: Conjunctivae and EOM are normal. Pupils are equal, round, and reactive to light.  Neck: Neck supple.  Cardiovascular: Normal rate.   Pulmonary/Chest: Effort normal.  Neurological: He is alert and oriented to person, place, and time. No cranial nerve deficit.  Psychiatric: He has a normal mood and affect. His behavior is normal.    Wt Readings from Last 3 Encounters:  08/03/14 198 lb (89.812 kg)  03/02/14 211 lb (95.709 kg)  01/28/14 219 lb 9.6 oz (99.61 kg)    BP 124/78  Pulse 66  Temp(Src) 98.5 F (36.9 C)  Resp 16  Ht 5\' 10"  (1.778 m)  Wt  198 lb (89.812 kg)  BMI 28.41 kg/m2  SpO2 96% Assessment & Plan:  HL HTN  congrats on wt loss Flu shot Reck lipids I have completed the patient encounter in its entirety as documented by the scribe, with editing by me where necessary. Alvira Hecht P. Laney Pastor, M.D.

## 2014-08-03 NOTE — Patient Instructions (Signed)
Influenza Vaccine (Flu Vaccine, Inactivated or Recombinant) 2014-2015: What You Need to Know 1. Why get vaccinated? Influenza ("flu") is a contagious disease that spreads around the [place] every winter, usually between October and May. Flu is caused by influenza viruses, and is spread mainly by coughing, sneezing, and close contact. Anyone can get flu, but the risk of getting flu is highest among children. Symptoms come on suddenly and may last several days. They can include:  fever/chills  sore throat  muscle aches  fatigue  cough  headache  runny or stuffy nose Flu can make some people much sicker than others. These people include young children, people 65 and older, pregnant women, and people with certain health conditions-such as heart, lung or kidney disease, nervous system disorders, or a weakened immune system. Flu vaccination is especially important for these people, and anyone in close contact with them. Flu can also lead to pneumonia, and make existing medical conditions worse. It can cause diarrhea and seizures in children. Each year thousands of people in the [place] die from flu, and many more are hospitalized. Flu vaccine is the best protection against flu and its complications. Flu vaccine also helps prevent spreading flu from person to person. 2. Inactivated and recombinant flu vaccines You are getting an injectable flu vaccine, which is either an "inactivated" or "recombinant" vaccine. These vaccines do not contain any live influenza virus. They are given by injection with a needle, and often called the "flu shot."  A different live, attenuated (weakened) influenza vaccine is sprayed into the nostrils. This vaccine is described in a separate Vaccine Information Statement. Flu vaccination is recommended every year. Some children 6 months through 8 years of age might need two doses during one year. Flu viruses are always changing. Each year's flu vaccine is made  to protect against 3 or 4 viruses that are likely to cause disease that year. Flu vaccine cannot prevent all cases of flu, but it is the best defense against the disease.  It takes about 2 weeks for protection to develop after the vaccination, and protection lasts several months to a year. Some illnesses that are not caused by influenza virus are often mistaken for flu. Flu vaccine will not prevent these illnesses. It can only prevent influenza. Some inactivated flu vaccine contains a very small amount of a mercury-based preservative called thimerosal. Studies have shown that thimerosal in vaccines is not harmful, but flu vaccines that do not contain a preservative are available. 3. Some people should not get this vaccine Tell the person who gives you the vaccine:  If you have any severe, life-threatening allergies. If you ever had a life-threatening allergic reaction after a dose of flu vaccine, or have a severe allergy to any part of this vaccine, including (for example) an allergy to gelatin, antibiotics, or eggs, you may be advised not to get vaccinated. Most, but not all, types of flu vaccine contain a small amount of egg protein.  If you ever had Guillain-Barr Syndrome (a severe paralyzing illness, also called GBS). Some people with a history of GBS should not get this vaccine. This should be discussed with your doctor.  If you are not feeling well. It is usually okay to get flu vaccine when you have a mild illness, but you might be advised to wait until you feel better. You should come back when you are better. 4. Risks of a vaccine reaction With a vaccine, like any medicine, there is a chance of side   effects. These are usually mild and go away on their own. Problems that could happen after any vaccine:  Brief fainting spells can happen after any medical procedure, including vaccination. Sitting or lying down for about 15 minutes can help prevent fainting, and injuries caused by a fall. Tell  your doctor if you feel dizzy, or have vision changes or ringing in the ears.  Severe shoulder pain and reduced range of motion in the arm where a shot was given can happen, very rarely, after a vaccination.  Severe allergic reactions from a vaccine are very rare, estimated at less than 1 in a million doses. If one were to occur, it would usually be within a few minutes to a few hours after the vaccination. Mild problems following inactivated flu vaccine:  soreness, redness, or swelling where the shot was given  hoarseness  sore, red or itchy eyes  cough  fever  aches  headache  itching  fatigue If these problems occur, they usually begin soon after the shot and last 1 or 2 days. Moderate problems following inactivated flu vaccine:  Young children who get inactivated flu vaccine and pneumococcal vaccine (PCV13) at the same time may be at increased risk for seizures caused by fever. Ask your doctor for more information. Tell your doctor if a child who is getting flu vaccine has ever had a seizure. Inactivated flu vaccine does not contain live flu virus, so you cannot get the flu from this vaccine. As with any medicine, there is a very remote chance of a vaccine causing a serious injury or death. The safety of vaccines is always being monitored. For more information, visit: www.cdc.gov/vaccinesafety/ 5. What if there is a serious reaction? What should I look for?  Look for anything that concerns you, such as signs of a severe allergic reaction, very high fever, or behavior changes. Signs of a severe allergic reaction can include hives, swelling of the face and throat, difficulty breathing, a fast heartbeat, dizziness, and weakness. These would start a few minutes to a few hours after the vaccination. What should I do?  If you think it is a severe allergic reaction or other emergency that can't wait, call 9-1-1 and get the person to the nearest hospital. Otherwise, call your  doctor.  Afterward, the reaction should be reported to the Vaccine Adverse Event Reporting System (VAERS). Your doctor should file this report, or you can do it yourself through the VAERS web site at www.vaers.hhs.gov, or by calling 1-800-822-7967. VAERS does not give medical advice. 6. The National Vaccine Injury Compensation Program The National Vaccine Injury Compensation Program (VICP) is a federal program that was created to compensate people who may have been injured by certain vaccines. Persons who believe they may have been injured by a vaccine can learn about the program and about filing a claim by calling 1-800-338-2382 or visiting the VICP website at www.hrsa.gov/vaccinecompensation. There is a time limit to file a claim for compensation. 7. How can I learn more?  Ask your health care provider.  Call your local or state health department.  Contact the Centers for Disease Control and Prevention (CDC):  Call 1-800-232-4636 (1-800-CDC-INFO) or  Visit CDC's website at www.cdc.gov/flu CDC Vaccine Information Statement (Interim) Inactivated Influenza Vaccine (06/22/2013) Document Released: 08/15/2006 Document Revised: 03/07/2014 Document Reviewed: 10/08/2013 ExitCare Patient Information 2015 [employer]. This information is not intended to replace advice given to you by your health care provider. Make sure you discuss any questions you have with your health   care provider.  

## 2014-10-07 ENCOUNTER — Other Ambulatory Visit: Payer: Self-pay | Admitting: Internal Medicine

## 2014-10-07 NOTE — Telephone Encounter (Signed)
Dr Laney Pastor, pt was in to see you for med RFs in Sept, but don't see albuterol discussed recently. Do you want to give RFs?

## 2015-01-10 ENCOUNTER — Other Ambulatory Visit: Payer: Self-pay | Admitting: Internal Medicine

## 2015-04-04 ENCOUNTER — Ambulatory Visit (INDEPENDENT_AMBULATORY_CARE_PROVIDER_SITE_OTHER): Payer: BLUE CROSS/BLUE SHIELD | Admitting: Family Medicine

## 2015-04-04 VITALS — BP 136/94 | HR 87 | Temp 99.3°F | Resp 16 | Ht 70.0 in | Wt 214.4 lb

## 2015-04-04 DIAGNOSIS — W57XXXA Bitten or stung by nonvenomous insect and other nonvenomous arthropods, initial encounter: Secondary | ICD-10-CM

## 2015-04-04 DIAGNOSIS — E785 Hyperlipidemia, unspecified: Secondary | ICD-10-CM | POA: Diagnosis not present

## 2015-04-04 DIAGNOSIS — L02419 Cutaneous abscess of limb, unspecified: Secondary | ICD-10-CM | POA: Diagnosis not present

## 2015-04-04 DIAGNOSIS — T148 Other injury of unspecified body region: Secondary | ICD-10-CM | POA: Diagnosis not present

## 2015-04-04 DIAGNOSIS — L03119 Cellulitis of unspecified part of limb: Secondary | ICD-10-CM

## 2015-04-04 LAB — COMPREHENSIVE METABOLIC PANEL
ALT: 32 U/L (ref 0–53)
AST: 24 U/L (ref 0–37)
Albumin: 4.7 g/dL (ref 3.5–5.2)
Alkaline Phosphatase: 44 U/L (ref 39–117)
BUN: 16 mg/dL (ref 6–23)
CO2: 22 mEq/L (ref 19–32)
Calcium: 9.7 mg/dL (ref 8.4–10.5)
Chloride: 106 mEq/L (ref 96–112)
Creat: 1.08 mg/dL (ref 0.50–1.35)
Glucose, Bld: 109 mg/dL — ABNORMAL HIGH (ref 70–99)
Potassium: 4 mEq/L (ref 3.5–5.3)
Sodium: 141 mEq/L (ref 135–145)
Total Bilirubin: 0.7 mg/dL (ref 0.2–1.2)
Total Protein: 6.9 g/dL (ref 6.0–8.3)

## 2015-04-04 LAB — LIPID PANEL
Cholesterol: 224 mg/dL — ABNORMAL HIGH (ref 0–200)
HDL: 39 mg/dL — ABNORMAL LOW (ref >=40)
LDL Cholesterol: 153 mg/dL — ABNORMAL HIGH (ref 0–99)
Total CHOL/HDL Ratio: 5.7 Ratio
Triglycerides: 160 mg/dL — ABNORMAL HIGH (ref <150)
VLDL: 32 mg/dL (ref 0–40)

## 2015-04-04 MED ORDER — MUPIROCIN 2 % EX OINT: 1.0000 "application " | TOPICAL_OINTMENT | Freq: Three times a day (TID) | CUTANEOUS | Status: DC

## 2015-04-04 MED ORDER — SIMVASTATIN 40 MG PO TABS: 40.0000 mg | ORAL_TABLET | Freq: Every evening | ORAL | Status: DC

## 2015-04-04 MED ORDER — SULFAMETHOXAZOLE-TRIMETHOPRIM 800-160 MG PO TABS: 1.0000 | ORAL_TABLET | Freq: Two times a day (BID) | ORAL | Status: DC

## 2015-04-04 NOTE — Patient Instructions (Signed)
Until you taking the simvastatin 1 daily  We will let you know the results of your labs shortly over the next week  Continue getting regular exercise  Keep the infected bite area clean and apply the mupirocin ointment once or twice daily. If it is getting worse or draining more please return  Take the sulfamethoxazole one twice daily for infection

## 2015-04-04 NOTE — Progress Notes (Signed)
  Subjective:  Patient ID: Joel Salas, male    DOB: 07-13-1966  Age: 49 y.o. MRN: [id number]  Patient regularly sees Dr. Laney Pastor. He has no major acute complaints today except for a old tick bite on his right lateral thigh which failed fails to heal. He was bit there about 5 weeks ago. A day after doing mountain biking he pulled the tick off. It was small. He never developed any flulike illness. However the place is persisted with a little ulcerated area and cellulitis. Review of systems is really unremarkable. HEENT normal. Cardiovascular and respiratory GI/GU all normal. He has not yet had a colonoscopy. Will get that with his next physical. He is a little upset today because he's had weight over 3 hours because he could not can appointment next door.  Past family social history reviewed   Objective:   Healthy-appearing man a little overweight no acute distress. Chest clear. Heart regular without murmurs. Abdomen soft without masses or tenderness. No hepatosplenomegaly. On the right lateral thigh there is a 1 cm ulcerated area which is crusted little. He has a couple of centimeters of erythema surrounding that.  Assessment & Plan:   Assessment: Cellulitis right thigh Hyperlipidemia  Plan: He is to return if that crusted area does not heal up on his thigh. He had not taken medication for about a week's, so I expect the cholesterol to be a little high. Patient Instructions  Until you taking the simvastatin 1 daily  We will let you know the results of your labs shortly over the next week  Continue getting regular exercise  Keep the infected bite area clean and apply the mupirocin ointment once or twice daily. If it is getting worse or draining more please return  Take the sulfamethoxazole one twice daily for infection    HOPPER,DAVID, MD 04/04/2015

## 2015-04-05 ENCOUNTER — Encounter: Payer: Self-pay | Admitting: Family Medicine

## 2015-05-25 ENCOUNTER — Other Ambulatory Visit: Payer: Self-pay | Admitting: Internal Medicine

## 2015-06-27 ENCOUNTER — Other Ambulatory Visit: Payer: Self-pay | Admitting: Internal Medicine

## 2015-07-26 ENCOUNTER — Other Ambulatory Visit: Payer: Self-pay | Admitting: Internal Medicine

## 2015-08-30 ENCOUNTER — Other Ambulatory Visit: Payer: Self-pay | Admitting: Physician Assistant

## 2015-09-06 ENCOUNTER — Ambulatory Visit (INDEPENDENT_AMBULATORY_CARE_PROVIDER_SITE_OTHER): Payer: BLUE CROSS/BLUE SHIELD | Admitting: Internal Medicine

## 2015-09-06 VITALS — BP 115/80 | HR 61 | Temp 98.1°F | Resp 16 | Ht 71.0 in | Wt 217.0 lb

## 2015-09-06 DIAGNOSIS — Z23 Encounter for immunization: Secondary | ICD-10-CM

## 2015-09-18 NOTE — Progress Notes (Signed)
Had to reschedule

## 2015-09-25 ENCOUNTER — Telehealth: Payer: Self-pay

## 2015-09-25 ENCOUNTER — Other Ambulatory Visit: Payer: Self-pay | Admitting: Internal Medicine

## 2015-09-25 NOTE — Telephone Encounter (Signed)
Pt LM about lab results but we have seen him since May. LMOM to CB to see what he needed.

## 2015-10-25 ENCOUNTER — Ambulatory Visit (INDEPENDENT_AMBULATORY_CARE_PROVIDER_SITE_OTHER): Payer: BLUE CROSS/BLUE SHIELD | Admitting: Internal Medicine

## 2015-10-25 ENCOUNTER — Encounter: Payer: Self-pay | Admitting: Internal Medicine

## 2015-10-25 VITALS — BP 145/86 | HR 76 | Temp 98.4°F | Resp 16 | Ht 71.0 in | Wt 213.0 lb

## 2015-10-25 DIAGNOSIS — Z1211 Encounter for screening for malignant neoplasm of colon: Secondary | ICD-10-CM

## 2015-10-25 DIAGNOSIS — E785 Hyperlipidemia, unspecified: Secondary | ICD-10-CM

## 2015-10-25 DIAGNOSIS — Z0000 Encounter for general adult medical examination without abnormal findings: Secondary | ICD-10-CM | POA: Diagnosis not present

## 2015-10-25 LAB — POCT GLYCOSYLATED HEMOGLOBIN (HGB A1C): Hemoglobin A1C: 5.9

## 2015-10-25 LAB — HEMOGLOBIN A1C
Hgb A1c MFr Bld: 6 % — ABNORMAL HIGH (ref <5.7)
Mean Plasma Glucose: 126 mg/dL — ABNORMAL HIGH (ref <117)

## 2015-10-26 LAB — CBC WITH DIFFERENTIAL/PLATELET
Basophils Absolute: 0 10*3/uL (ref 0.0–0.1)
Basophils Relative: 0 % (ref 0–1)
Eosinophils Absolute: 0.1 10*3/uL (ref 0.0–0.7)
Eosinophils Relative: 1 % (ref 0–5)
HCT: 47.6 % (ref 39.0–52.0)
Hemoglobin: 16.9 g/dL (ref 13.0–17.0)
Lymphocytes Relative: 37 % (ref 12–46)
Lymphs Abs: 3.1 10*3/uL (ref 0.7–4.0)
MCH: 31.5 pg (ref 26.0–34.0)
MCHC: 35.5 g/dL (ref 30.0–36.0)
MCV: 88.8 fL (ref 78.0–100.0)
MPV: 10.3 fL (ref 8.6–12.4)
Monocytes Absolute: 0.5 10*3/uL (ref 0.1–1.0)
Monocytes Relative: 6 % (ref 3–12)
Neutro Abs: 4.6 10*3/uL (ref 1.7–7.7)
Neutrophils Relative %: 56 % (ref 43–77)
Platelets: 251 10*3/uL (ref 150–400)
RBC: 5.36 MIL/uL (ref 4.22–5.81)
RDW: 14.2 % (ref 11.5–15.5)
WBC: 8.3 10*3/uL (ref 4.0–10.5)

## 2015-10-26 LAB — LIPID PANEL
Cholesterol: 232 mg/dL — ABNORMAL HIGH (ref 125–200)
HDL: 44 mg/dL (ref >=40)
LDL Cholesterol: 163 mg/dL — ABNORMAL HIGH (ref <130)
Total CHOL/HDL Ratio: 5.3 Ratio — ABNORMAL HIGH (ref <=5.0)
Triglycerides: 124 mg/dL (ref <150)
VLDL: 25 mg/dL (ref <30)

## 2015-10-26 LAB — COMPREHENSIVE METABOLIC PANEL
ALT: 29 U/L (ref 9–46)
AST: 19 U/L (ref 10–35)
Albumin: 4.7 g/dL (ref 3.6–5.1)
Alkaline Phosphatase: 48 U/L (ref 40–115)
BUN: 13 mg/dL (ref 7–25)
CO2: 25 mmol/L (ref 20–31)
Calcium: 9.9 mg/dL (ref 8.6–10.3)
Chloride: 100 mmol/L (ref 98–110)
Creat: 0.93 mg/dL (ref 0.70–1.33)
Glucose, Bld: 107 mg/dL — ABNORMAL HIGH (ref 65–99)
Potassium: 4.3 mmol/L (ref 3.5–5.3)
Sodium: 134 mmol/L — ABNORMAL LOW (ref 135–146)
Total Bilirubin: 0.8 mg/dL (ref 0.2–1.2)
Total Protein: 7.1 g/dL (ref 6.1–8.1)

## 2015-10-26 LAB — PSA: PSA: 1.65 ng/mL (ref <=4.00)

## 2015-10-27 MED ORDER — PAROXETINE HCL 20 MG PO TABS: 20.0000 mg | ORAL_TABLET | Freq: Every day | ORAL | Status: DC

## 2015-10-27 MED ORDER — ATORVASTATIN CALCIUM 40 MG PO TABS: 40.0000 mg | ORAL_TABLET | Freq: Every day | ORAL | Status: DC

## 2015-10-27 NOTE — Progress Notes (Signed)
Subjective:    Patient ID: Joel Salas, male    DOB: [birth year], 49 y.o.   MRN: [id number]  HPIannual Patient Active Problem List   Diagnosis Date Noted  . H/O cervical spine surgery-2011 09/29/2013  . Hyperlipidemia 07/01/2012  . Elevated BP 07/01/2012  . BMI 30.0-30.9,adult 07/01/2012  last sugar borderline Quit smok 2003  Doing well-[employer] [place] heritage important with links to [employer] day functions [employer] for family heritage--may get trip to [place] biker[place]trails  HM-needs first colonos  Review of Systems 14pt per form neg    Objective:   Physical Exam  Constitutional: He is oriented to person, place, and time. He appears well-developed and well-nourished.  HENT:  Head: Normocephalic and atraumatic.  Right Ear: Hearing, tympanic membrane, external ear and ear canal normal.  Left Ear: Hearing, tympanic membrane, external ear and ear canal normal.  Nose: Nose normal.  Mouth/Throat: Uvula is midline, oropharynx is clear and moist and mucous membranes are normal.  Eyes: Conjunctivae, EOM and lids are normal. Pupils are equal, round, and reactive to light. Right eye exhibits no discharge. Left eye exhibits no discharge. No scleral icterus.  Neck: Trachea normal and normal range of motion. Neck supple. Carotid bruit is not present.  Cardiovascular: Normal rate, regular rhythm, normal heart sounds, intact distal pulses and normal pulses.   No murmur heard. Pulmonary/Chest: Effort normal and breath sounds normal. No respiratory distress. He has no wheezes. He has no rhonchi. He has no rales.  Abdominal: Soft. Normal appearance and bowel sounds are normal. He exhibits no abdominal bruit. There is no tenderness.  Genitourinary:  Rectal without masses and prostate soft, symm w/out nodules  Musculoskeletal: Normal range of motion. He exhibits no edema or tenderness.  Lymphadenopathy:       Head (right side): No submental, no submandibular, no  tonsillar, no preauricular, no posterior auricular and no occipital adenopathy present.       Head (left side): No submental, no submandibular, no tonsillar, no preauricular, no posterior auricular and no occipital adenopathy present.    He has no cervical adenopathy.  Neurological: He is alert and oriented to person, place, and time. He has normal strength and normal reflexes. No cranial nerve deficit or sensory deficit. Coordination and gait normal.  Skin: Skin is warm, dry and intact. No lesion and no rash noted.  Psychiatric: He has a normal mood and affect. His speech is normal and behavior is normal. Judgment and thought content normal.       Assessment & Plan:  Annual physical exam - Plan: CBC with Differential/Platelet, Comprehensive metabolic panel, Lipid panel, PSA, POCT glycosylated hemoglobin (Hb A1C)  Special screening for malignant neoplasms, colon - Plan: Lipid panel, Ambulatory referral to Gastroenterology  Hyperlipidemia - Plan: Hemoglobin A1c  Results for orders placed or performed in visit on 10/25/15  CBC with Differential/Platelet  Result Value Ref Range   WBC 8.3 4.0 - 10.5 K/uL   RBC 5.36 4.22 - 5.81 MIL/uL   Hemoglobin 16.9 13.0 - 17.0 g/dL   HCT 47.6 39.0 - 52.0 %   MCV 88.8 78.0 - 100.0 fL   MCH 31.5 26.0 - 34.0 pg   MCHC 35.5 30.0 - 36.0 g/dL   RDW 14.2 11.5 - 15.5 %   Platelets 251 150 - 400 K/uL   MPV 10.3 8.6 - 12.4 fL   Neutrophils Relative % 56 43 - 77 %   Neutro Abs 4.6 1.7 - 7.7 K/uL  Lymphocytes Relative 37 12 - 46 %   Lymphs Abs 3.1 0.7 - 4.0 K/uL   Monocytes Relative 6 3 - 12 %   Monocytes Absolute 0.5 0.1 - 1.0 K/uL   Eosinophils Relative 1 0 - 5 %   Eosinophils Absolute 0.1 0.0 - 0.7 K/uL   Basophils Relative 0 0 - 1 %   Basophils Absolute 0.0 0.0 - 0.1 K/uL   Smear Review Criteria for review not met   Comprehensive metabolic panel  Result Value Ref Range   Sodium 134 (L) 135 - 146 mmol/L   Potassium 4.3 3.5 - 5.3 mmol/L   Chloride  100 98 - 110 mmol/L   CO2 25 20 - 31 mmol/L   Glucose, Bld 107 (H) 65 - 99 mg/dL   BUN 13 7 - 25 mg/dL   Creat 0.93 0.70 - 1.33 mg/dL   Total Bilirubin 0.8 0.2 - 1.2 mg/dL   Alkaline Phosphatase 48 40 - 115 U/L   AST 19 10 - 35 U/L   ALT 29 9 - 46 U/L   Total Protein 7.1 6.1 - 8.1 g/dL   Albumin 4.7 3.6 - 5.1 g/dL   Calcium 9.9 8.6 - 10.3 mg/dL  Lipid panel  Result Value Ref Range   Cholesterol 232 (H) 125 - 200 mg/dL   Triglycerides 124 <150 mg/dL   HDL 44 >=40 mg/dL   Total CHOL/HDL Ratio 5.3 (H) <=5.0 Ratio   VLDL 25 <30 mg/dL   LDL Cholesterol 163 (H) <130 mg/dL  PSA  Result Value Ref Range   PSA 1.65 <=4.00 ng/mL  Hemoglobin A1c  Result Value Ref Range   Hgb A1c MFr Bld 6.0 (H) <5.7 %   Mean Plasma Glucose 126 (H) <117 mg/dL  POCT glycosylated hemoglobin (Hb A1C)  Result Value Ref Range   Hemoglobin A1C 5.9    Sent mychart message asking for wt loss next Read by Astrid Divine Maris at 10/27/2015 12:51 PM     The HgbA1c is a test of risk for diabetes--it is slightly elevated--on the margin. You can halt this rise with 10-15 lbs of weight loss!! Gotta ride that bike more and eat a few less carbs(except for beer carbs!)  I changed your zocor to lipitor since LDL was high because it's a better drug and you'll get more bang for your buck.  The rest look good!! Slightly low sodium means nothing  I've set up 17mof/u appt with MAngelyn Puntlike him!       F/u M Clark 6 months

## 2015-10-30 ENCOUNTER — Encounter: Payer: Self-pay | Admitting: Internal Medicine

## 2015-10-30 DIAGNOSIS — Z1211 Encounter for screening for malignant neoplasm of colon: Secondary | ICD-10-CM

## 2015-10-31 NOTE — Telephone Encounter (Signed)
Referred to [facility] for colonoscopy. You should here soon. Set up followup with Philis Fendt for 6 mo Cramps hard to figure but not related to sodium level. Likely made better by paying careful attention to not being dehydrated . Also can try 4 shot tonic water at bedtime( quinine).

## 2015-11-01 ENCOUNTER — Encounter: Payer: Self-pay | Admitting: Gastroenterology

## 2015-11-25 ENCOUNTER — Telehealth: Payer: Self-pay

## 2015-11-25 NOTE — Telephone Encounter (Signed)
error 

## 2015-11-28 ENCOUNTER — Ambulatory Visit (INDEPENDENT_AMBULATORY_CARE_PROVIDER_SITE_OTHER): Payer: BLUE CROSS/BLUE SHIELD | Admitting: Family Medicine

## 2015-11-28 VITALS — BP 138/80 | HR 67 | Temp 98.2°F | Resp 18 | Ht 70.75 in | Wt 212.2 lb

## 2015-11-28 DIAGNOSIS — E785 Hyperlipidemia, unspecified: Secondary | ICD-10-CM

## 2015-11-28 DIAGNOSIS — M791 Myalgia, unspecified site: Secondary | ICD-10-CM

## 2015-11-28 DIAGNOSIS — R252 Cramp and spasm: Secondary | ICD-10-CM | POA: Diagnosis not present

## 2015-11-28 DIAGNOSIS — Z5181 Encounter for therapeutic drug level monitoring: Secondary | ICD-10-CM

## 2015-11-28 DIAGNOSIS — IMO0001 Reserved for inherently not codable concepts without codable children: Secondary | ICD-10-CM

## 2015-11-28 DIAGNOSIS — R55 Syncope and collapse: Secondary | ICD-10-CM

## 2015-11-28 DIAGNOSIS — R030 Elevated blood-pressure reading, without diagnosis of hypertension: Secondary | ICD-10-CM

## 2015-11-28 LAB — POCT CBC
Granulocyte percent: 60.1 %G (ref 37–80)
HCT, POC: 49.4 % (ref 43.5–53.7)
Hemoglobin: 16.9 g/dL (ref 14.1–18.1)
Lymph, poc: 2.5 (ref 0.6–3.4)
MCH, POC: 31.2 pg (ref 27–31.2)
MCHC: 34.3 g/dL (ref 31.8–35.4)
MCV: 91 fL (ref 80–97)
MID (cbc): 0.6 (ref 0–0.9)
MPV: 7.8 fL (ref 0–99.8)
POC Granulocyte: 4.6 (ref 2–6.9)
POC LYMPH PERCENT: 32.6 %L (ref 10–50)
POC MID %: 7.3 %M (ref 0–12)
Platelet Count, POC: 211 10*3/uL (ref 142–424)
RBC: 5.43 M/uL (ref 4.69–6.13)
RDW, POC: 13.9 %
WBC: 7.7 10*3/uL (ref 4.6–10.2)

## 2015-11-28 LAB — POCT URINALYSIS DIP (MANUAL ENTRY)
Bilirubin, UA: NEGATIVE
Blood, UA: NEGATIVE
Glucose, UA: NEGATIVE
Leukocytes, UA: NEGATIVE
Nitrite, UA: NEGATIVE
Protein Ur, POC: NEGATIVE
Spec Grav, UA: 1.025
Urobilinogen, UA: 0.2
pH, UA: 5.5

## 2015-11-28 LAB — COMPREHENSIVE METABOLIC PANEL
ALT: 30 U/L (ref 9–46)
AST: 19 U/L (ref 10–35)
Albumin: 4.7 g/dL (ref 3.6–5.1)
Alkaline Phosphatase: 51 U/L (ref 40–115)
BUN: 19 mg/dL (ref 7–25)
CO2: 23 mmol/L (ref 20–31)
Calcium: 10.1 mg/dL (ref 8.6–10.3)
Chloride: 105 mmol/L (ref 98–110)
Creat: 1.18 mg/dL (ref 0.70–1.33)
Glucose, Bld: 130 mg/dL — ABNORMAL HIGH (ref 65–99)
Potassium: 4.3 mmol/L (ref 3.5–5.3)
Sodium: 141 mmol/L (ref 135–146)
Total Bilirubin: 0.6 mg/dL (ref 0.2–1.2)
Total Protein: 6.9 g/dL (ref 6.1–8.1)

## 2015-11-28 LAB — POCT SEDIMENTATION RATE: Sed Rate: 9

## 2015-11-28 LAB — CK: Total CK: 89 U/L (ref 7–232)

## 2015-11-28 LAB — MAGNESIUM: Magnesium: 1.9 mg/dL (ref 1.5–2.5)

## 2015-11-28 LAB — C-REACTIVE PROTEIN: CRP: <0.5 mg/dL (ref <0.60)

## 2015-11-28 LAB — TSH: TSH: 0.626 u[IU]/mL (ref 0.350–4.500)

## 2015-11-28 MED ORDER — PAROXETINE HCL 20 MG PO TABS: 10.0000 mg | ORAL_TABLET | Freq: Every day | ORAL | Status: DC

## 2015-11-28 NOTE — Patient Instructions (Signed)
I think it is unlikely that your lipitor caused the syncope but it could be causing some of the muscle aches.  I would recommend either stopping this for several weeks really pay attention to your muscle aches to see what your baseline is.  Consider adding in a coenzyme q10 supplement WITH your statin.  Consider trying a magnesium supplement at night.  Make sure you are drinking plenty of water and getting foods with potassium in your diet. Cut your paxil in half. If you do not hear about the cardiology or neurology appointment in a week, call our referrals department. F/u in 3 weeks after the med changes to see if they have made any difference and where you want to go with your meds from there. Syncope Syncope is a medical term for fainting or passing out. This means you lose consciousness and drop to the ground. People are generally unconscious for less than 5 minutes. You may have some muscle twitches for up to 15 seconds before waking up and returning to normal. Syncope occurs more often in older adults, but it can happen to anyone. While most causes of syncope are not dangerous, syncope can be a sign of a serious medical problem. It is important to seek medical care.  CAUSES  Syncope is caused by a sudden drop in blood flow to the brain. The specific cause is often not determined. Factors that can bring on syncope include:  Taking medicines that lower blood pressure.  Sudden changes in posture, such as standing up quickly.  Taking more medicine than prescribed.  Standing in one place for too long.  Seizure disorders.  Dehydration and excessive exposure to heat.  Low blood sugar (hypoglycemia).  Straining to have a bowel movement.  Heart disease, irregular heartbeat, or other circulatory problems.  Fear, emotional distress, seeing blood, or severe pain. SYMPTOMS  Right before fainting, you may:  Feel dizzy or light-headed.  Feel nauseous.  See all white or all black in your  field of vision.  Have cold, clammy skin. DIAGNOSIS  Your health care provider will ask about your symptoms, perform a physical exam, and perform an electrocardiogram (ECG) to record the electrical activity of your heart. Your health care provider may also perform other heart or blood tests to determine the cause of your syncope which may include:  Transthoracic echocardiogram (TTE). During echocardiography, sound waves are used to evaluate how blood flows through your heart.  Transesophageal echocardiogram (TEE).  Cardiac monitoring. This allows your health care provider to monitor your heart rate and rhythm in real time.  Holter monitor. This is a portable device that records your heartbeat and can help diagnose heart arrhythmias. It allows your health care provider to track your heart activity for several days, if needed.  Stress tests by exercise or by giving medicine that makes the heart beat faster. TREATMENT  In most cases, no treatment is needed. Depending on the cause of your syncope, your health care provider may recommend changing or stopping some of your medicines. HOME CARE INSTRUCTIONS  Have someone stay with you until you feel stable.  Do not drive, use machinery, or play sports until your health care provider says it is okay.  Keep all follow-up appointments as directed by your health care provider.  Lie down right away if you start feeling like you might faint. Breathe deeply and steadily. Wait until all the symptoms have passed.  Drink enough fluids to keep your urine clear or pale yellow.  If  you are taking blood pressure or heart medicine, get up slowly and take several minutes to sit and then stand. This can reduce dizziness. SEEK IMMEDIATE MEDICAL CARE IF:   You have a severe headache.  You have unusual pain in the chest, abdomen, or back.  You are bleeding from your mouth or rectum, or you have black or tarry stool.  You have an irregular or very fast  heartbeat.  You have pain with breathing.  You have repeated fainting or seizure-like jerking during an episode.  You faint when sitting or lying down.  You have confusion.  You have trouble walking.  You have severe weakness.  You have vision problems. If you fainted, call your local emergency services (911 in [place] Do not drive yourself to the hospital.    This information is not intended to replace advice given to you by your health care provider. Make sure you discuss any questions you have with your health care provider.   Document Released: 10/21/2005 Document Revised: 03/07/2015 Document Reviewed: 12/20/2011 Elsevier Interactive Patient Education 2016 [place].   Potassium Content of Foods Potassium is a mineral found in many foods and drinks. It helps keep fluids and minerals balanced in your body and affects how steadily your heart beats. Potassium also helps control your blood pressure and keep your muscles and nervous system healthy. Certain health conditions and medicines may change the balance of potassium in your body. When this happens, you can help balance your level of potassium through the foods that you do or do not eat. Your health care provider or dietitian may recommend an amount of potassium that you should have each day. The following lists of foods provide the amount of potassium (in parentheses) per serving in each item. HIGH IN POTASSIUM  The following foods and beverages have 200 mg or more of potassium per serving:  Apricots, 2 raw or 5 dry (200 mg).  Artichoke, 1 medium (345 mg).  Avocado, raw,  each (245 mg).  Banana, 1 medium (425 mg).  Beans, lima, or baked beans, canned,  cup (280 mg).  Beans, white, canned,  cup (595 mg).  Beef roast, 3 oz (320 mg).  Beef, ground, 3 oz (270 mg).  Beets, raw or cooked,  cup (260 mg).  Bran muffin, 2 oz (300 mg).  Broccoli,  cup (230 mg).  Brussels sprouts,  cup (250 mg).  Cantaloupe,   cup (215 mg).  Cereal, 100% bran,  cup (200-400 mg).  Cheeseburger, single, fast food, 1 each (225-400 mg).  Chicken, 3 oz (220 mg).  Clams, canned, 3 oz (535 mg).  Crab, 3 oz (225 mg).  Dates, 5 each (270 mg).  Dried beans and peas,  cup (300-475 mg).  Figs, dried, 2 each (260 mg).  Fish: halibut, tuna, cod, snapper, 3 oz (480 mg).  Fish: salmon, haddock, swordfish, perch, 3 oz (300 mg).  Fish, tuna, canned 3 oz (200 mg).  [place] fries, fast food, 3 oz (470 mg).  Granola with fruit and nuts,  cup (200 mg).  Grapefruit juice,  cup (200 mg).  Greens, beet,  cup (655 mg).  Honeydew melon,  cup (200 mg).  Kale, raw, 1 cup (300 mg).  Kiwi, 1 medium (240 mg).  Kohlrabi, rutabaga, parsnips,  cup (280 mg).  Lentils,  cup (365 mg).  Mango, 1 each (325 mg).  Milk, chocolate, 1 cup (420 mg).  Milk: nonfat, low-fat, whole, buttermilk, 1 cup (350-380 mg).  Molasses, 1 Tbsp (295 mg).  Mushrooms,  cup (280) mg.  Nectarine, 1 each (275 mg).  Nuts: almonds, peanuts, hazelnuts, [place], cashew, mixed, 1 oz (200 mg).  Nuts, pistachios, 1 oz (295 mg).  Orange, 1 each (240 mg).  Orange juice,  cup (235 mg).  Papaya, medium,  fruit (390 mg).  Peanut butter, chunky, 2 Tbsp (240 mg).  Peanut butter, smooth, 2 Tbsp (210 mg).  Pear, 1 medium (200 mg).  Pomegranate, 1 whole (400 mg).  Pomegranate juice,  cup (215 mg).  Pork, 3 oz (350 mg).  Potato chips, salted, 1 oz (465 mg).  Potato, baked with skin, 1 medium (925 mg).  Potatoes, boiled,  cup (255 mg).  Potatoes, mashed,  cup (330 mg).  Prune juice,  cup (370 mg).  Prunes, 5 each (305 mg).  Pudding, chocolate,  cup (230 mg).  Pumpkin, canned,  cup (250 mg).  Raisins, seedless,  cup (270 mg).  Seeds, sunflower or pumpkin, 1 oz (240 mg).  Soy milk, 1 cup (300 mg).  Spinach,  cup (420 mg).  Spinach, canned,  cup (370 mg).  Sweet potato, baked with skin, 1 medium (450  mg).  Swiss chard,  cup (480 mg).  Tomato or vegetable juice,  cup (275 mg).  Tomato sauce or puree,  cup (400-550 mg).  Tomato, raw, 1 medium (290 mg).  Tomatoes, canned,  cup (200-300 mg).  [place], 3 oz (250 mg).  Wheat germ, 1 oz (250 mg).  Winter squash,  cup (250 mg).  Yogurt, plain or fruited, 6 oz (260-435 mg).  Zucchini,  cup (220 mg). MODERATE IN POTASSIUM The following foods and beverages have 50-200 mg of potassium per serving:  Apple, 1 each (150 mg).  Apple juice,  cup (150 mg).  Applesauce,  cup (90 mg).  Apricot nectar,  cup (140 mg).  Asparagus, small spears,  cup or 6 spears (155 mg).  Bagel, cinnamon raisin, 1 each (130 mg).  Bagel, egg or plain, 4 in., 1 each (70 mg).  Beans, green,  cup (90 mg).  Beans, yellow,  cup (190 mg).  Beer, regular, 12 oz (100 mg).  Beets, canned,  cup (125 mg).  Blackberries,  cup (115 mg).  Blueberries,  cup (60 mg).  Bread, whole wheat, 1 slice (70 mg).  Broccoli, raw,  cup (145 mg).  Cabbage,  cup (150 mg).  Carrots, cooked or raw,  cup (180 mg).  Cauliflower, raw,  cup (150 mg).  Celery, raw,  cup (155 mg).  Cereal, bran flakes, cup (120-150 mg).  Cheese, cottage,  cup (110 mg).  Cherries, 10 each (150 mg).  Chocolate, 1 oz bar (165 mg).  Coffee, brewed 6 oz (90 mg).  Corn,  cup or 1 ear (195 mg).  Cucumbers,  cup (80 mg).  Egg, large, 1 each (60 mg).  Eggplant,  cup (60 mg).  Endive, raw, cup (80 mg).  English muffin, 1 each (65 mg).  Fish, orange roughy, 3 oz (150 mg).  Frankfurter, beef or pork, 1 each (75 mg).  Fruit cocktail,  cup (115 mg).  Grape juice,  cup (170 mg).  Grapefruit,  fruit (175 mg).  Grapes,  cup (155 mg).  Greens: kale, turnip, collard,  cup (110-150 mg).  Ice cream or frozen yogurt, chocolate,  cup (175 mg).  Ice cream or frozen yogurt, vanilla,  cup (120-150 mg).  Lemons, limes, 1 each (80 mg).  Lettuce,  all types, 1 cup (100 mg).  Mixed vegetables,  cup (150 mg).  Mushrooms, raw,  cup (110 mg).  Nuts: walnuts, pecans, or macadamia, 1 oz (125 mg).  Oatmeal,  cup (80 mg).  Okra,  cup (110 mg).  Onions, raw,  cup (120 mg).  Peach, 1 each (185 mg).  Peaches, canned,  cup (120 mg).  Pears, canned,  cup (120 mg).  Peas, green, frozen,  cup (90 mg).  Peppers, green,  cup (130 mg).  Peppers, red,  cup (160 mg).  Pineapple juice,  cup (165 mg).  Pineapple, fresh or canned,  cup (100 mg).  Plums, 1 each (105 mg).  Pudding, vanilla,  cup (150 mg).  Raspberries,  cup (90 mg).  Rhubarb,  cup (115 mg).  Rice, wild,  cup (80 mg).  Shrimp, 3 oz (155 mg).  Spinach, raw, 1 cup (170 mg).  Strawberries,  cup (125 mg).  Summer squash  cup (175-200 mg).  Swiss chard, raw, 1 cup (135 mg).  Tangerines, 1 each (140 mg).  Tea, brewed, 6 oz (65 mg).  Turnips,  cup (140 mg).  Watermelon,  cup (85 mg).  Wine, red, table, 5 oz (180 mg).  Wine, white, table, 5 oz (100 mg). LOW IN POTASSIUM The following foods and beverages have less than 50 mg of potassium per serving.  Bread, white, 1 slice (30 mg).  Carbonated beverages, 12 oz (less than 5 mg).  Cheese, 1 oz (20-30 mg).  Cranberries,  cup (45 mg).  Cranberry juice cocktail,  cup (20 mg).  Fats and oils, 1 Tbsp (less than 5 mg).  Hummus, 1 Tbsp (32 mg).  Nectar: papaya, mango, or pear,  cup (35 mg).  Rice, white or brown,  cup (50 mg).  Spaghetti or macaroni,  cup cooked (30 mg).  Tortilla, flour or corn, 1 each (50 mg).  Waffle, 4 in., 1 each (50 mg).  Water chestnuts,  cup (40 mg).   This information is not intended to replace advice given to you by your health care provider. Make sure you discuss any questions you have with your health care provider.   Document Released: 06/04/2005 Document Revised: 10/26/2013 Document Reviewed: 09/17/2013 Elsevier Interactive Patient  Education [employer].

## 2015-11-28 NOTE — Progress Notes (Signed)
Subjective:  By signing my name below, I, Rawaa Al Rifaie, attest that this documentation has been prepared under the direction and in the presence of Delman Cheadle, MD.  Leandra Kern, Medical Scribe. 11/28/2015.  9:09 AM.    Patient ID: Joel Salas, male    DOB: 08/26/1966, 50 y.o.   MRN: [id number] Chief Complaint  Patient presents with  . Loss of Consciousness    Fainted last Friday. Thinks its related to his medication change. States he feels fine now. Has not fainted since Friday.     HPI HPI Comments: Joel Salas is a 50 y.o. male who presents to [facility] complaining of experiencing a syncope episode that occurred 4 days ago.  Pt was seen 1 month prior with his PCP for his annual physical. At that time, he was pre-diabetic and zocor was changed to Lipitor. He was noticing cramping before bed time. He does not look like he has had a prior cardiac work-up.   Today, pt reports that he was standing, felt a cramp in his hamstrings, experienced very hot sensation, and fell on his friend who was standing beside him. Pt notes that his friend reported that during the episode his eyes were open, and his right arm was shaking uncontrollably for about 15 seconds, however the pt has no recollection of that. Pt indicates that after the episode he felt very well rested, and experienced no other symptoms after that. He notes that prior to the episode he had just been to a game, had lunch, and about 2 beers. Pt reports that the only other symptoms he is experiencing currently is sinus headaches. He takes Advil occasionally, but no other OTC medications. Pt states that the only change that he recently underwent was in his medication from zocor to Lipitor, therefore he is suspecting that his syncope was related to that. Pt notes that he has had the muscle cramping since he has been on Statins, he stopped his zocor for several weeks before his physical and did notice improvement  in his muscle cramping. Though unsure if completely resolved. Pt has also been experiencing chronic muscle aches and shoulder pain, and he attributes that to riding his bike.  Pt reports that about 1.5 years ago, he experienced a hypotensive episode after having just started a new blood pressure medication, he experiences severe dizziness with this. He stopped taking that medication shortly after. Pt also states that he had experienced a syncopal episode while he was taking Paxil. He indicates that at that episode he was walking, felt severe dizziness, and fell to the ground as he lost consciousness. Pt has been on the paxil for only 2 years since he had several friend passed away and a brother with kidney failure. No prior antidepressants. Pt reports having a cardiac stress test about 5-6 years ago that showed normal results. Pt denies incontinence, mentally slowed after the episode, swelling in the legs, chest tightness chest pain, palpitations, shortness of breath, nausea, or vomiting, vision changes, weight changes, urinary or bowl complications.  He reports not family history of seizures, or cardiac history.   Patient Active Problem List   Diagnosis Date Noted  . H/O cervical spine surgery-2011 09/29/2013  . Hyperlipidemia 07/01/2012  . Elevated BP 07/01/2012  . BMI 30.0-30.9,adult 07/01/2012   Past Medical History  Diagnosis Date  . Anxiety   . Allergy   . Arthritis    Past Surgical History  Procedure Laterality Date  .  Spine surgery     No Known Allergies Prior to Admission medications   Medication Sig Start Date End Date Taking? Authorizing Provider  albuterol (VENTOLIN HFA) 108 (90 BASE) MCG/ACT inhaler Inhale 2 puffs into the lungs every 4 (four) hours as needed. PATIENT NEEDS OFFICE VISIT FOR ADDITIONAL REFILLS 07/27/15  Yes Leandrew Koyanagi, MD  atorvastatin (LIPITOR) 40 MG tablet Take 1 tablet (40 mg total) by mouth daily. 10/27/15  Yes Leandrew Koyanagi, MD  PARoxetine  (PAXIL) 20 MG tablet Take 1 tablet (20 mg total) by mouth daily. 10/27/15  Yes Leandrew Koyanagi, MD   Social History   Social History  . Marital Status: Single    Spouse Name: N/A  . Number of Children: N/A  . Years of Education: N/A   Occupational History  . Equities trader    Social History Main Topics  . Smoking status: Former Research scientist (life sciences)  . Smokeless tobacco: Not on file  . Alcohol Use: Yes     Comment: 12-15  . Drug Use: No  . Sexual Activity: Yes   Other Topics Concern  . Not on file   Social History Narrative   Single. Education: college.    Review of Systems  Constitutional: Negative for appetite change and unexpected weight change.  Eyes: Negative for visual disturbance.  Respiratory: Negative for chest tightness and shortness of breath.   Cardiovascular: Negative for chest pain, palpitations and leg swelling.  Gastrointestinal: Negative for nausea, vomiting, diarrhea and constipation.  Genitourinary: Negative for dysuria, frequency, enuresis and difficulty urinating.  Musculoskeletal: Positive for myalgias.  Neurological: Positive for tremors, syncope and headaches. Negative for dizziness, seizures and speech difficulty.      Objective:   Physical Exam  Constitutional: He is oriented to person, place, and time. He appears well-developed and well-nourished. No distress.  HENT:  Head: Normocephalic and atraumatic.  Eyes: EOM are normal. Pupils are equal, round, and reactive to light.  Neck: Neck supple. No thyromegaly present.  Cardiovascular: Normal rate, regular rhythm, S1 normal, S2 normal and normal heart sounds.   No murmur heard. No carotid breweries.  Pulmonary/Chest: Effort normal and breath sounds normal. No respiratory distress. He has no wheezes. He has no rales.  Lymphadenopathy:    He has no cervical adenopathy.  Neurological: He is alert and oriented to person, place, and time. No cranial nerve deficit.  Skin: Skin is warm and dry.    Psychiatric: He has a normal mood and affect. His behavior is normal.  Nursing note and vitals reviewed.   BP 138/80 mmHg  Pulse 67  Temp(Src) 98.2 F (36.8 C) (Oral)  Resp 18  Ht 5' 10.75" (1.797 m)  Wt 212 lb 3.2 oz (96.253 kg)  BMI 29.81 kg/m2  SpO2 97%  EKG reading: Normal sinus bradycardia, no ischemic changes.   Orthostatics borderline. Heart rate increased by 19 but systolic bp decreased by only 7 from lying to standing.    Results for orders placed or performed in visit on 11/28/15  POCT CBC  Result Value Ref Range   WBC 7.7 4.6 - 10.2 K/uL   Lymph, poc 2.5 0.6 - 3.4   POC LYMPH PERCENT 32.6 10 - 50 %L   MID (cbc) 0.6 0 - 0.9   POC MID % 7.3 0 - 12 %M   POC Granulocyte 4.6 2 - 6.9   Granulocyte percent 60.1 37 - 80 %G   RBC 5.43 4.69 - 6.13 M/uL   Hemoglobin 16.9 14.1 - 18.1  g/dL   HCT, POC 49.4 43.5 - 53.7 %   MCV 91.0 80 - 97 fL   MCH, POC 31.2 27 - 31.2 pg   MCHC 34.3 31.8 - 35.4 g/dL   RDW, POC 13.9 %   Platelet Count, POC 211 142 - 424 K/uL   MPV 7.8 0 - 99.8 fL  POCT urinalysis dipstick  Result Value Ref Range   Color, UA yellow yellow   Clarity, UA clear clear   Glucose, UA negative negative   Bilirubin, UA negative negative   Ketones, POC UA trace (5) (A) negative   Spec Grav, UA 1.025    Blood, UA negative negative   pH, UA 5.5    Protein Ur, POC negative negative   Urobilinogen, UA 0.2    Nitrite, UA Negative Negative   Leukocytes, UA Negative Negative      Assessment & Plan:   Pt advised to cut his Paxil in 1/2 from 20 to 10 as could potentially be cause of sxs. If his mood sxs worsen, ok to increase back.  Rec to stop his Lipitor for several weeks - highly unlikely to have anything to do with the syncopal episode but is likely cause of chronic myalgias - if sxs do not resolve off of lipitor, than ok to restart but try mag qhs and/or coenzyme q10 w/ it.  If myalgias do resolve, then ok to stay off lipitor for 4-6 mos to get baseline lipid  panel off statin to see if really needs statin therapy as ASCVD risk calculator seems to point that he might not using his last lipid panel where he had been off of statin x sev wks.  1. Syncope and collapse - unknown etiology as isolated episode -refer to cardiology and neurology for further eval  2. Myalgia   3. Muscle cramp   4. Medication monitoring encounter   5. Hyperlipidemia   6. Elevated BP     Orders Placed This Encounter  Procedures  . Comprehensive metabolic panel  . C-reactive protein  . CK  . TSH  . Magnesium  . Ambulatory referral to Cardiology    Referral Priority:  Routine    Referral Type:  Consultation    Referral Reason:  Specialty Services Required    Requested Specialty:  Cardiology    Number of Visits Requested:  1  . Ambulatory referral to Neurology    Referral Priority:  Routine    Referral Type:  Consultation    Referral Reason:  Specialty Services Required    Requested Specialty:  Neurology    Number of Visits Requested:  1  . Orthostatic vital signs  . POCT CBC  . POCT SEDIMENTATION RATE  . POCT urinalysis dipstick  . EKG 12-Lead    Meds ordered this encounter  Medications  . PARoxetine (PAXIL) 20 MG tablet    Sig: Take 0.5 tablets (10 mg total) by mouth daily.    Dispense:  90 tablet    Refill:  0   Over 40 min spent in face-to-face evaluation of and consultation with patient and coordination of care.  Over 50% of this time was spent counseling this patient.  I personally performed the services described in this documentation, which was scribed in my presence. The recorded information has been reviewed and considered, and addended by me as needed.  Delman Cheadle, MD MPH

## 2015-12-02 ENCOUNTER — Encounter: Payer: Self-pay | Admitting: Internal Medicine

## 2015-12-04 ENCOUNTER — Ambulatory Visit (AMBULATORY_SURGERY_CENTER): Payer: BLUE CROSS/BLUE SHIELD | Admitting: *Deleted

## 2015-12-04 VITALS — Ht 70.75 in | Wt 219.0 lb

## 2015-12-04 DIAGNOSIS — Z1211 Encounter for screening for malignant neoplasm of colon: Secondary | ICD-10-CM

## 2015-12-04 MED ORDER — NA SULFATE-K SULFATE-MG SULF 17.5-3.13-1.6 GM/177ML PO SOLN: 1.0000 | Freq: Once | ORAL | Status: DC

## 2015-12-04 NOTE — Progress Notes (Signed)
No egg or soy allergy known to patient  No issues with past sedation with any surgeries  or procedures, no intubation problems  No diet pills per patient No home 02 use per patient  No blood thinners per patient  Medication Samples have been provided to the patient. Pt has blue cross blue advantage  sample given--  Drug name: suprep  Qty: 1  LOTNE:[id number]  Exp.Date: 12-18  The patient has been instructed regarding the correct time, dose, and frequency of taking this medication, including desired effects and most common side effects.   Joel Salas 3:03 PM 12/04/2015

## 2015-12-06 ENCOUNTER — Other Ambulatory Visit: Payer: Self-pay | Admitting: Internal Medicine

## 2015-12-11 ENCOUNTER — Ambulatory Visit (INDEPENDENT_AMBULATORY_CARE_PROVIDER_SITE_OTHER): Payer: BLUE CROSS/BLUE SHIELD | Admitting: Internal Medicine

## 2015-12-11 ENCOUNTER — Encounter: Payer: Self-pay | Admitting: Internal Medicine

## 2015-12-11 VITALS — BP 152/92 | HR 72 | Ht 71.0 in | Wt 218.0 lb

## 2015-12-11 DIAGNOSIS — E785 Hyperlipidemia, unspecified: Secondary | ICD-10-CM | POA: Diagnosis not present

## 2015-12-11 DIAGNOSIS — R030 Elevated blood-pressure reading, without diagnosis of hypertension: Secondary | ICD-10-CM | POA: Diagnosis not present

## 2015-12-11 DIAGNOSIS — R55 Syncope and collapse: Secondary | ICD-10-CM

## 2015-12-11 DIAGNOSIS — IMO0001 Reserved for inherently not codable concepts without codable children: Secondary | ICD-10-CM

## 2015-12-11 NOTE — Assessment & Plan Note (Signed)
His history his c/w neurally mediated syncope. He will undergo a 2D echo to rule out structural heart disease and I have asked him to avoid caffeine, and ETOH, and increase his salt an fluid intake. He will undergo watchful waiting. I will hold off on an ILR for now.

## 2015-12-11 NOTE — Progress Notes (Signed)
HPI Mr. Cleaves is referred today for evaluation of syncope. He is a pleasant 50 yo man who has been healthy. He notes that he passed out 3 years ago after standing and walking across the room. He did not injure himself nor did he seek medical attention. Several weeks ago he was visiting friends in [place] and got a cramp in his leg was in pain and passed out. He was out for only a few seconds, did not bite his tongue or lose continence and felt poorly for several hrs after the episode. The patient did not get nauseated or vomit. He notes that his mother has had problems with syncope and actually has a PPM. No family h/o sudden death.  No Known Allergies   Current Outpatient Prescriptions  Medication Sig Dispense Refill  . albuterol (VENTOLIN HFA) 108 (90 BASE) MCG/ACT inhaler Inhale 2 puffs into the lungs every 4 (four) hours as needed. PATIENT NEEDS OFFICE VISIT FOR ADDITIONAL REFILLS 18 g 0  . cetirizine (ZYRTEC) 10 MG chewable tablet Chew 10 mg by mouth daily. 1-2 as needed    . ibuprofen (ADVIL,MOTRIN) 200 MG tablet Take 200 mg by mouth every 8 (eight) hours as needed.    Marland Kitchen PARoxetine (PAXIL) 20 MG tablet Take 0.5 tablets (10 mg total) by mouth daily. 90 tablet 0  . atorvastatin (LIPITOR) 40 MG tablet Take 1 tablet (40 mg total) by mouth daily. (Patient not taking: Reported on 12/04/2015) 90 tablet 3  . Na Sulfate-K Sulfate-Mg Sulf (SUPREP BOWEL PREP) SOLN Take 1 kit by mouth once. suprep as directed. No substitutions 354 mL 0   No current facility-administered medications for this visit.     Past Medical History  Diagnosis Date  . Anxiety   . Allergy   . Arthritis   . Hyperlipidemia     meds on hold- borderline  . Asthma     allery induced asthma    ROS:   All systems reviewed and negative except as noted in the HPI.   Past Surgical History  Procedure Laterality Date  . Spine surgery    . Tonsillectomy    . Wisdom tooth extraction       Family History    Problem Relation Age of Onset  . Diabetes Mother   . Multiple sclerosis Mother   . Hyperlipidemia Mother   . Hypertension Mother   . Arthritis Father   . Hyperlipidemia Father   . Hypertension Father   . Colon polyps Father   . Diabetes Brother   . Cancer Maternal Grandmother     pancreatic  . Cancer Maternal Grandfather     pancreatic  . Colon cancer Neg Hx   . Esophageal cancer Neg Hx   . Rectal cancer Neg Hx   . Stomach cancer Neg Hx      Social History   Social History  . Marital Status: Single    Spouse Name: N/A  . Number of Children: N/A  . Years of Education: N/A   Occupational History  . Equities trader    Social History Main Topics  . Smoking status: Former Research scientist (life sciences)  . Smokeless tobacco: Former Systems developer  . Alcohol Use: 0.0 oz/week    0 Standard drinks or equivalent per week     Comment: 12-15 per week   . Drug Use: No  . Sexual Activity: Yes   Other Topics Concern  . Not on file   Social History Narrative   Single. Education:  college.     BP 152/92 mmHg  Pulse 72  Ht '5\' 11"'$  (1.803 m)  Wt 218 lb (98.884 kg)  BMI 30.42 kg/m2  Physical Exam:  Well appearing middle aged man, NAD HEENT: Unremarkable Neck:  No JVD, no thyromegally Lymphatics:  No adenopathy Back:  No CVA tenderness Lungs:  Clear with no wheezes HEART:  Regular rate rhythm, no murmurs, no rubs, no clicks Abd:  soft, positive bowel sounds, no organomegally, no rebound, no guarding Ext:  2 plus pulses, no edema, no cyanosis, no clubbing Skin:  No rashes no nodules Neuro:  CN II through XII intact, motor grossly intact  EKG - reviewed - NSR   Assess/Plan:

## 2015-12-11 NOTE — Patient Instructions (Addendum)
Medication Instructions:  Your physician recommends that you continue on your current medications as directed. Please refer to the Current Medication list given to you today.  Labwork: None ordered  Testing/Procedures: Your physician has requested that you have an echocardiogram. Echocardiography is a painless test that uses sound waves to create images of your heart. It provides your doctor with information about the size and shape of your heart and how well your heart's chambers and valves are working. This procedure takes approximately one hour. There are no restrictions for this procedure.  Follow-Up: Your physician recommends that you schedule a follow-up appointment in: 4 months with Dr. Lovena Le.  If you need a refill on your cardiac medications before your next appointment, please call your pharmacy.  Thank you for choosing [facility]!!

## 2015-12-11 NOTE — Assessment & Plan Note (Signed)
He is encouraged to maintain a low fat diet and he will continue Lipitor.

## 2015-12-11 NOTE — Assessment & Plan Note (Signed)
His blood pressure is high today but he notes that at home it is normal. He is encouraged to avoid excess sodium but maintain adequate hydration.

## 2015-12-13 ENCOUNTER — Other Ambulatory Visit: Payer: Self-pay | Admitting: Internal Medicine

## 2015-12-16 NOTE — Telephone Encounter (Signed)
Dr Laney Pastor, you saw pt for CPE in Dec, but this med hasn't been discussed lately. Do you want to give RFs?

## 2015-12-18 ENCOUNTER — Ambulatory Visit (AMBULATORY_SURGERY_CENTER): Payer: BLUE CROSS/BLUE SHIELD | Admitting: Gastroenterology

## 2015-12-18 ENCOUNTER — Encounter: Payer: Self-pay | Admitting: Gastroenterology

## 2015-12-18 VITALS — BP 103/64 | HR 59 | Temp 97.0°F | Resp 31 | Ht 70.75 in | Wt 219.0 lb

## 2015-12-18 DIAGNOSIS — Z1211 Encounter for screening for malignant neoplasm of colon: Secondary | ICD-10-CM | POA: Diagnosis not present

## 2015-12-18 DIAGNOSIS — D123 Benign neoplasm of transverse colon: Secondary | ICD-10-CM

## 2015-12-18 DIAGNOSIS — D128 Benign neoplasm of rectum: Secondary | ICD-10-CM

## 2015-12-18 DIAGNOSIS — D129 Benign neoplasm of anus and anal canal: Secondary | ICD-10-CM

## 2015-12-18 DIAGNOSIS — D125 Benign neoplasm of sigmoid colon: Secondary | ICD-10-CM

## 2015-12-18 MED ORDER — SODIUM CHLORIDE 0.9 % IV SOLN: 500.0000 mL | INTRAVENOUS | Status: DC

## 2015-12-18 NOTE — Progress Notes (Signed)
Called to room to assist during endoscopic procedure.  Patient ID and intended procedure confirmed with present staff. Received instructions for my participation in the procedure from the performing physician.  

## 2015-12-18 NOTE — Op Note (Signed)
[place]  [address] [place], [id number]   COLONOSCOPY PROCEDURE REPORT  PATIENT: Joel Salas, Joel Salas  MR#: [id number] BIRTHDATE: 07/02/1966 , 49  yrs. old GENDER: male ENDOSCOPIST: Ladene Artist, MD, [facility] REFERRED BY:  Tami Lin, M.D. PROCEDURE DATE:  12/18/2015 PROCEDURE:   Colonoscopy, screening, Colonoscopy with biopsy, and Colonoscopy with snare polypectomy First Screening Colonoscopy - Avg.  risk and is 50 yrs.  old or older Yes.  Prior Negative Screening - Now for repeat screening. N/A  History of Adenoma - Now for follow-up colonoscopy & has been > or = to 3 yrs.  N/A  Polyps removed today? Yes ASA CLASS:   Class II INDICATIONS:Screening for colonic neoplasia and Colorectal Neoplasm Risk Assessment for this procedure is average risk. MEDICATIONS: Monitored anesthesia care and Propofol 300 mg IV  DESCRIPTION OF PROCEDURE:   After the risks benefits and alternatives of the procedure were thoroughly explained, informed consent was obtained.  The digital rectal exam revealed no abnormalities of the rectum.   The LB [id number]-[id number]  endoscope was introduced through the anus and advanced to the cecum, which was identified by both the appendix and ileocecal valve. No adverse events experienced.   The quality of the prep was good.  (Suprep was used)  The instrument was then slowly withdrawn as the colon was fully examined. Estimated blood loss is zero unless otherwise noted in this procedure report.    COLON FINDINGS: Six sessile polyps measuring 5-6 mm in size were found in the rectum (2) and sigmoid colon (4)  Polypectomies were performed with a cold snare.  The resection was complete, the polyp tissue was completely retrieved and sent to histology.   A sessile polyp measuring 5 mm in size was found in the transverse colon.  A polypectomy was performed with cold forceps.  The resection was complete, the polyp tissue was completely retrieved  and sent to histology.   There was mild diverticulosis noted in the sigmoid colon with associated petechiae and muscular hypertrophy.   The examination was otherwise normal.  Retroflexed views revealed no abnormalities. The time to cecum = 1.5 Withdrawal time = 16.1   The scope was withdrawn and the procedure completed.   COMPLICATIONS: There were no immediate complications.    ENDOSCOPIC IMPRESSION: 1.   Six sessile polyps in the rectum and sigmoid colon; polypectomies performed with a cold snare 2.   Sessile polyp in the transverse colon; polypectomy performed with cold forceps 3.   Mild diverticulosis in the sigmoid colon  RECOMMENDATIONS: 1.  Await pathology results 2.  High fiber diet with liberal fluid intake. 3.  Review pathology to determine timing of repeat colonoscopy  eSigned:  Ladene Artist, MD, [facility] 12/18/2015 2:32 PM     PATIENT NAME:  Joel Salas, Joel Salas MR#: [id number]

## 2015-12-18 NOTE — Progress Notes (Signed)
Report to PACU, RN, vss, BBS= Clear.  

## 2015-12-18 NOTE — Progress Notes (Signed)
Ventolin inhaler at the bedside with pt's label placed on inhaler. maw

## 2015-12-18 NOTE — Patient Instructions (Signed)
YOU HAD AN ENDOSCOPIC PROCEDURE TODAY AT THE [facility]:   Refer to the procedure report that was given to you for any specific questions about what was found during the examination.  If the procedure report does not answer your questions, please call your gastroenterologist to clarify.  If you requested that your care partner not be given the details of your procedure findings, then the procedure report has been included in a sealed envelope for you to review at your convenience later.  YOU SHOULD EXPECT: Some feelings of bloating in the abdomen. Passage of more gas than usual.  Walking can help get rid of the air that was put into your GI tract during the procedure and reduce the bloating. If you had a lower endoscopy (such as a colonoscopy or flexible sigmoidoscopy) you may notice spotting of blood in your stool or on the toilet paper. If you underwent a bowel prep for your procedure, you may not have a normal bowel movement for a few days.  Please Note:  You might notice some irritation and congestion in your nose or some drainage.  This is from the oxygen used during your procedure.  There is no need for concern and it should clear up in a day or so.  SYMPTOMS TO REPORT IMMEDIATELY:   Following lower endoscopy (colonoscopy or flexible sigmoidoscopy):  Excessive amounts of blood in the stool  Significant tenderness or worsening of abdominal pains  Swelling of the abdomen that is new, acute  Fever of 100F or higher    For urgent or emergent issues, a gastroenterologist can be reached at any hour by calling [phone].   DIET: Your first meal following the procedure should be a small meal and then it is ok to progress to your normal diet. Heavy or fried foods are harder to digest and may make you feel nauseous or bloated.  Likewise, meals heavy in dairy and vegetables can increase bloating.  Drink plenty of fluids but you should avoid alcoholic beverages for 24  hours.  ACTIVITY:  You should plan to take it easy for the rest of today and you should NOT DRIVE or use heavy machinery until tomorrow (because of the sedation medicines used during the test).    FOLLOW UP: Our staff will call the number listed on your records the next business day following your procedure to check on you and address any questions or concerns that you may have regarding the information given to you following your procedure. If we do not reach you, we will leave a message.  However, if you are feeling well and you are not experiencing any problems, there is no need to return our call.  We will assume that you have returned to your regular daily activities without incident.  If any biopsies were taken you will be contacted by phone or by letter within the next 1-3 weeks.  Please call us at [phone] if you have not heard about the biopsies in 3 weeks.    SIGNATURES/CONFIDENTIALITY: You and/or your care partner have signed paperwork which will be entered into your electronic medical record.  These signatures attest to the fact that that the information above on your After Visit Summary has been reviewed and is understood.  Full responsibility of the confidentiality of this discharge information lies with you and/or your care-partner.    INFORMATION ON POLYPS,DIVERTICULOSIS ,AND HIGH FIBER DIET GIVEN TO YOU TODAY 

## 2015-12-19 ENCOUNTER — Telehealth: Payer: Self-pay | Admitting: *Deleted

## 2015-12-19 NOTE — Telephone Encounter (Signed)
  Follow up Call-  Call back number 12/18/2015  Post procedure Call Back phone  # [phone] cell  Permission to leave phone message Yes     Patient questions:  Do you have a fever, pain , or abdominal swelling? No. Pain Score  0 *  Have you tolerated food without any problems? Yes.    Have you been able to return to your normal activities? Yes.    Do you have any questions about your discharge instructions: Diet   No. Medications  No. Follow up visit  No.  Do you have questions or concerns about your Care? No.  Actions: * If pain score is 4 or above: No action needed, pain <4.

## 2015-12-22 ENCOUNTER — Ambulatory Visit (INDEPENDENT_AMBULATORY_CARE_PROVIDER_SITE_OTHER): Payer: BLUE CROSS/BLUE SHIELD | Admitting: Neurology

## 2015-12-22 ENCOUNTER — Encounter: Payer: Self-pay | Admitting: Neurology

## 2015-12-22 VITALS — BP 140/95 | HR 91 | Ht 70.75 in | Wt 221.5 lb

## 2015-12-22 DIAGNOSIS — R55 Syncope and collapse: Secondary | ICD-10-CM | POA: Diagnosis not present

## 2015-12-22 NOTE — Patient Instructions (Signed)

## 2015-12-22 NOTE — Progress Notes (Signed)
Reason for visit: Syncope  Referring physician: Dr. [name] is a 50 y.o. male  History of present illness:  Mr. Joel Salas is a 50 year old right-handed white male with a history of a recent syncopal event that occurred within the last month. The patient had been on a long airline flight, and he had ridden 20 miles on a bicycle, and then he had gone to a gathering where he was drinking beer. The patient was sitting, and then he had a cramp in his hamstring, he stood up suddenly, and then felt warm and tingly for a few moments, then he lost consciousness for about 5 seconds. There is some question of some twitching of the right arm with the syncopal event, the patient recovered consciousness rapidly, and felt completely normal afterwards. He did not bite his tongue, he did not lose control of the bowels or the bladder. The patient denies any focal numbness or weakness of the face, arms, or legs. He denies any headache or visual changes. He had a near syncopal event 3 years ago when he had stood up suddenly, he began to walk, and he felt lightheaded. He fell to the ground, but he did not black out. He has had no other syncopal or near syncopal events in his lifetime. The patient denies chest pain or palpitations of the heart. He has been seen by cardiology recently. He comes to this office for an evaluation.  Past Medical History  Diagnosis Date  . Anxiety   . Allergy   . Arthritis   . Hyperlipidemia     meds on hold- borderline  . Asthma     allery induced asthma    Past Surgical History  Procedure Laterality Date  . Spine surgery    . Tonsillectomy    . Wisdom tooth extraction      Family History  Problem Relation Age of Onset  . Diabetes Mother   . Multiple sclerosis Mother   . Hyperlipidemia Mother   . Hypertension Mother   . Arthritis Father   . Hyperlipidemia Father   . Hypertension Father   . Colon polyps Father   . Prostate cancer Father   .  Diabetes Brother   . Cancer Maternal Grandmother     pancreatic  . Cancer Maternal Grandfather     pancreatic  . Colon cancer Neg Hx   . Esophageal cancer Neg Hx   . Rectal cancer Neg Hx   . Stomach cancer Neg Hx     Social history:  reports that he quit smoking about 13 years ago. He has quit using smokeless tobacco. He reports that he drinks about 7.2 oz of alcohol per week. He reports that he does not use illicit drugs.  Medications:  Prior to Admission medications   Medication Sig Start Date End Date Taking? Authorizing Provider  atorvastatin (LIPITOR) 40 MG tablet Take 1 tablet (40 mg total) by mouth daily. 10/27/15  Yes Leandrew Koyanagi, MD  ibuprofen (ADVIL,MOTRIN) 200 MG tablet Take 200 mg by mouth every 8 (eight) hours as needed.   Yes Historical Provider, MD  PARoxetine (PAXIL) 20 MG tablet Take 0.5 tablets (10 mg total) by mouth daily. 11/28/15  Yes Shawnee Knapp, MD  VENTOLIN HFA 108 [phone] Base) MCG/ACT inhaler INHALE 2 PUFFS INTO THE LUNGS EVERY 4 HOURS AS NEEDED 12/16/15  Yes Leandrew Koyanagi, MD  cetirizine (ZYRTEC) 10 MG chewable tablet Chew 10 mg by mouth daily. Reported on 12/22/2015  Historical Provider, MD     No Known Allergies  ROS:  Out of a complete 14 system review of symptoms, the patient complains only of the following symptoms, and all other reviewed systems are negative.  Ringing in the ears Cough, wheezing  Blood pressure 140/95, pulse 91, height 5' 10.75" (1.797 m), weight 221 lb 8 oz (100.472 kg).  Physical Exam  General: The patient is alert and cooperative at the time of the examination.  Eyes: Pupils are equal, round, and reactive to light. Discs are flat bilaterally.  Neck: The neck is supple, no carotid bruits are noted.  Respiratory: The respiratory examination is clear.  Cardiovascular: The cardiovascular examination reveals a regular rate and rhythm, no obvious murmurs or rubs are noted.  Skin: Extremities are without significant  edema.  Neurologic Exam  Mental status: The patient is alert and oriented x 3 at the time of the examination. The patient has apparent normal recent and remote memory, with an apparently normal attention span and concentration ability.  Cranial nerves: Facial symmetry is present. There is good sensation of the face to pinprick and soft touch bilaterally. The strength of the facial muscles and the muscles to head turning and shoulder shrug are normal bilaterally. Speech is well enunciated, no aphasia or dysarthria is noted. Extraocular movements are full. Visual fields are full. The tongue is midline, and the patient has symmetric elevation of the soft palate. No obvious hearing deficits are noted.  Motor: The motor testing reveals 5 over 5 strength of all 4 extremities. Good symmetric motor tone is noted throughout.  Sensory: Sensory testing is intact to pinprick, soft touch, vibration sensation, and position sense on all 4 extremities. No evidence of extinction is noted.  Coordination: Cerebellar testing reveals good finger-nose-finger and heel-to-shin bilaterally.  Gait and station: Gait is normal. Tandem gait is normal. Romberg is negative. No drift is seen.  Reflexes: Deep tendon reflexes are symmetric and normal bilaterally. Toes are downgoing bilaterally.   Assessment/Plan:  1. Syncope  The patient had a recent syncopal event associated with a period of dehydration. The event is not consistent with a vasovagal syncopal event. At this point, I would not pursue any further workup, the patient is to contact our office if he has any further episodes. I would not restrict driving.  Jill Alexanders MD 12/22/2015 9:22 AM  [facility] [address] [place], [place] [address]  Phone [phone] Fax [phone]

## 2015-12-25 ENCOUNTER — Encounter: Payer: Self-pay | Admitting: Gastroenterology

## 2015-12-26 ENCOUNTER — Ambulatory Visit (HOSPITAL_COMMUNITY): Payer: BLUE CROSS/BLUE SHIELD | Attending: Cardiology

## 2015-12-26 ENCOUNTER — Other Ambulatory Visit: Payer: Self-pay

## 2015-12-26 DIAGNOSIS — I351 Nonrheumatic aortic (valve) insufficiency: Secondary | ICD-10-CM | POA: Diagnosis not present

## 2015-12-26 DIAGNOSIS — R55 Syncope and collapse: Secondary | ICD-10-CM | POA: Diagnosis not present

## 2015-12-26 DIAGNOSIS — I517 Cardiomegaly: Secondary | ICD-10-CM | POA: Insufficient documentation

## 2015-12-26 DIAGNOSIS — I340 Nonrheumatic mitral (valve) insufficiency: Secondary | ICD-10-CM | POA: Diagnosis not present

## 2015-12-26 DIAGNOSIS — Z87891 Personal history of nicotine dependence: Secondary | ICD-10-CM | POA: Insufficient documentation

## 2015-12-26 DIAGNOSIS — E785 Hyperlipidemia, unspecified: Secondary | ICD-10-CM | POA: Insufficient documentation

## 2015-12-26 DIAGNOSIS — I5189 Other ill-defined heart diseases: Secondary | ICD-10-CM | POA: Diagnosis not present

## 2016-04-09 ENCOUNTER — Ambulatory Visit: Payer: BLUE CROSS/BLUE SHIELD | Admitting: Internal Medicine

## 2016-04-19 ENCOUNTER — Ambulatory Visit (INDEPENDENT_AMBULATORY_CARE_PROVIDER_SITE_OTHER): Payer: BLUE CROSS/BLUE SHIELD | Admitting: Family Medicine

## 2016-04-19 VITALS — BP 120/82 | HR 70 | Temp 99.3°F | Resp 18 | Ht 70.75 in | Wt 213.0 lb

## 2016-04-19 DIAGNOSIS — B029 Zoster without complications: Secondary | ICD-10-CM | POA: Diagnosis not present

## 2016-04-19 MED ORDER — HYDROCODONE-ACETAMINOPHEN 5-325 MG PO TABS: 1.0000 | ORAL_TABLET | Freq: Four times a day (QID) | ORAL | Status: DC | PRN

## 2016-04-19 MED ORDER — VALACYCLOVIR HCL 1 G PO TABS: 1000.0000 mg | ORAL_TABLET | Freq: Three times a day (TID) | ORAL | Status: DC

## 2016-04-19 NOTE — Progress Notes (Signed)
By signing my name below I, Tereasa Coop, attest that this documentation has been prepared under the direction and in the presence of Wendie Agreste, MD. Electonically Signed. Tereasa Coop, Scribe 04/19/2016 at 8:34 AM  Subjective:    Patient ID: Joel Salas, male    DOB: 1965-12-10, 50 y.o.   MRN: [id number]  Chief Complaint  Patient presents with  . Herpes Zoster    CHEST,ARMS,BACK SHOULDER BLADE    HPI Joel Salas is a 50 y.o. male who presents to the [facility] complaining of rash on chest, arms, and back that has been worsening since onset 2 days ago. Rash started on left chest and felt like a sun burn. Rash started extending to arms and back and became pruritic. Rash becomes more painful when the rash is palpated. Pt has been taking Advil for pain with mild relief.   Pt has history of shingles approximately 15 years ago.  Pt denies feeling feverish, nauseous, HA, or general malaise.    Patient Active Problem List   Diagnosis Date Noted  . Syncope 12/11/2015  . H/O cervical spine surgery-2011 09/29/2013  . Hyperlipidemia 07/01/2012  . Elevated BP 07/01/2012  . BMI 30.0-30.9,adult 07/01/2012   Past Medical History  Diagnosis Date  . Anxiety   . Allergy   . Arthritis   . Hyperlipidemia     meds on hold- borderline  . Asthma     allery induced asthma   Past Surgical History  Procedure Laterality Date  . Spine surgery    . Tonsillectomy    . Wisdom tooth extraction     No Known Allergies Prior to Admission medications   Medication Sig Start Date End Date Taking? Authorizing Provider  cetirizine (ZYRTEC) 10 MG chewable tablet Chew 10 mg by mouth daily. Reported on 12/22/2015   Yes Historical Provider, MD  ibuprofen (ADVIL,MOTRIN) 200 MG tablet Take 200 mg by mouth every 8 (eight) hours as needed.   Yes Historical Provider, MD  VENTOLIN HFA 108 (90 Base) MCG/ACT inhaler INHALE 2 PUFFS INTO THE LUNGS EVERY 4 HOURS AS NEEDED 12/16/15   Yes Leandrew Koyanagi, MD  atorvastatin (LIPITOR) 40 MG tablet Take 1 tablet (40 mg total) by mouth daily. Patient not taking: Reported on 04/19/2016 10/27/15   Leandrew Koyanagi, MD  PARoxetine (PAXIL) 20 MG tablet Take 0.5 tablets (10 mg total) by mouth daily. Patient not taking: Reported on 04/19/2016 11/28/15   Shawnee Knapp, MD   Social History   Social History  . Marital Status: Single    Spouse Name: N/A  . Number of Children: N/A  . Years of Education: N/A   Occupational History  . Equities trader    Social History Main Topics  . Smoking status: Former Smoker    Quit date: 09/06/2002  . Smokeless tobacco: Former Systems developer  . Alcohol Use: 7.2 oz/week    0 Standard drinks or equivalent, 12 Cans of beer per week     Comment: 12-15 per week   . Drug Use: No  . Sexual Activity: Yes   Other Topics Concern  . Not on file   Social History Narrative   Single. Education: college.      Patient drinks about 8 cups of caffeine daily.   Patient is ambidextrous.       Review of Systems  Constitutional: Negative for fever.  Gastrointestinal: Negative for nausea.  Skin: Positive for rash (more painful than pruritic.).  Neurological: Negative  for headaches.       Objective:   Physical Exam  Constitutional: He is oriented to person, place, and time. He appears well-developed and well-nourished. No distress.  HENT:  Head: Normocephalic and atraumatic.  Eyes: Conjunctivae are normal. Pupils are equal, round, and reactive to light.  Neck: Neck supple.  Cardiovascular: Normal rate.   Pulmonary/Chest: Effort normal.  Musculoskeletal: Normal range of motion.  Neurological: He is alert and oriented to person, place, and time.  Skin: Skin is warm and dry.  Pt has a 2cm by 2.5cm patch of erythema over the volar aspect of his left upper arm proximal to the antecubital fossa. Pt also has similar 2cm by 4cm patch of erythema with overlying vesicles on left upper chest. Pt also has  a small area of erythema on left upper arm. Pt also has burning sensation on upper left back without apparent rash. Area of discomfort on back and chest is on the left T1 distribution.  Psychiatric: He has a normal mood and affect. His behavior is normal.  Nursing note and vitals reviewed.    Filed Vitals:   04/19/16 0815  BP: 120/82  Pulse: 70  Temp: 99.3 F (37.4 C)  TempSrc: Oral  Resp: 18  Height: 5' 10.75" (1.797 m)  Weight: 213 lb (96.616 kg)  SpO2: 98%         Assessment & Plan:   Joel Salas is a 50 y.o. male Shingles - Plan: valACYclovir (VALTREX) 1000 MG tablet, HYDROcodone-acetaminophen (NORCO/VICODIN) 5-325 MG tablet Rash and location consistent with shingles. Suspected T1 distribution. Start Valtrex, over-the-counter pain relievers for hydrocodone if needed. Side effects discussed. RTC precautions. Can have shingles vaccine in  approximately 6 months.  Meds ordered this encounter  Medications  . valACYclovir (VALTREX) 1000 MG tablet    Sig: Take 1 tablet (1,000 mg total) by mouth 3 (three) times daily.    Dispense:  21 tablet    Refill:  0  . HYDROcodone-acetaminophen (NORCO/VICODIN) 5-325 MG tablet    Sig: Take 1 tablet by mouth every 6 (six) hours as needed for moderate pain.    Dispense:  20 tablet    Refill:  0   Patient Instructions       IF you received an x-ray today, you will receive an invoice from [facility]. Please contact [facility] at [phone] with questions or concerns regarding your invoice.   IF you received labwork today, you will receive an invoice from [employer]. Please contact [facility] at [phone] with questions or concerns regarding your invoice.   Our billing staff will not be able to assist you with questions regarding bills from these companies.  You will be contacted with the lab results as soon as they are available. The fastest way to get your results is to activate  your My Chart account. Instructions are located on the last page of this paperwork. If you have not heard from [place] regarding the results in 2 weeks, please contact this office.    Start valtrex.  advil or Tylenol over-the-counter as needed. I did write a few hydrocodone if needed for more severe pain. Zoster vaccine can be given in about 6 months.  Return to the clinic or go to the nearest emergency room if any of your symptoms worsen or new symptoms occur.  Shingles Shingles, which is also known as herpes zoster, is an infection that causes a painful skin rash and fluid-filled blisters. Shingles is not related to genital  herpes, which is a sexually transmitted infection.   Shingles only develops in people who:  Have had chickenpox.  Have received the chickenpox vaccine. (This is rare.) CAUSES Shingles is caused by varicella-zoster virus (VZV). This is the same virus that causes chickenpox. After exposure to VZV, the virus stays in the body in an inactive (dormant) state. Shingles develops if the virus reactivates. This can happen many years after the initial exposure to VZV. It is not known what causes this virus to reactivate. RISK FACTORS People who have had chickenpox or received the chickenpox vaccine are at risk for shingles. Infection is more common in people who:  Are older than age 51.  Have a weakened defense (immune) system, such as those with HIV, AIDS, or cancer.  Are taking medicines that weaken the immune system, such as transplant medicines.  Are under great stress. SYMPTOMS Early symptoms of this condition include itching, tingling, and pain in an area on your skin. Pain may be described as burning, stabbing, or throbbing. A few days or weeks after symptoms start, a painful red rash appears, usually on one side of the body in a bandlike or beltlike pattern. The rash eventually turns into fluid-filled blisters that break open, scab over, and dry up in about 2-3 weeks. At  any time during the infection, you may also develop:  A fever.  Chills.  A headache.  An upset stomach. DIAGNOSIS This condition is diagnosed with a skin exam. Sometimes, skin or fluid samples are taken from the blisters before a diagnosis is made. These samples are examined under a microscope or sent to a lab for testing. TREATMENT There is no specific cure for this condition. Your health care provider will probably prescribe medicines to help you manage pain, recover more quickly, and avoid long-term problems. Medicines may include:  Antiviral drugs.  Anti-inflammatory drugs.  Pain medicines. If the area involved is on your face, you may be referred to a specialist, such as an eye doctor (ophthalmologist) or an ear, nose, and throat (ENT) doctor to help you avoid eye problems, chronic pain, or disability. HOME CARE INSTRUCTIONS Medicines  Take medicines only as directed by your health care provider.  Apply an anti-itch or numbing cream to the affected area as directed by your health care provider. Blister and Rash Care  Take a cool bath or apply cool compresses to the area of the rash or blisters as directed by your health care provider. This may help with pain and itching.  Keep your rash covered with a loose bandage (dressing). Wear loose-fitting clothing to help ease the pain of material rubbing against the rash.  Keep your rash and blisters clean with mild soap and cool water or as directed by your health care provider.  Check your rash every day for signs of infection. These include redness, swelling, and pain that lasts or increases.  Do not pick your blisters.  Do not scratch your rash. General Instructions  Rest as directed by your health care provider.  Keep all follow-up visits as directed by your health care provider. This is important.  Until your blisters scab over, your infection can cause chickenpox in people who have never had it or been vaccinated  against it. To prevent this from happening, avoid contact with other people, especially:  Babies.  Pregnant women.  Children who have eczema.  Elderly people who have transplants.  People who have chronic illnesses, such as leukemia or AIDS. SEEK MEDICAL CARE IF:  Your  pain is not relieved with prescribed medicines.  Your pain does not get better after the rash heals.  Your rash looks infected. Signs of infection include redness, swelling, and pain that lasts or increases. SEEK IMMEDIATE MEDICAL CARE IF:  The rash is on your face or nose.  You have facial pain, pain around your eye area, or loss of feeling on one side of your face.  You have ear pain or you have ringing in your ear.  You have loss of taste.  Your condition gets worse.   This information is not intended to replace advice given to you by your health care provider. Make sure you discuss any questions you have with your health care provider.   Document Released: 10/21/2005 Document Revised: 11/11/2014 Document Reviewed: 09/01/2014 Elsevier Interactive Patient Education [employer].     I personally performed the services described in this documentation, which was scribed in my presence. The recorded information has been reviewed and considered, and addended by me as needed.   Signed,   Merri Ray, MD [facility].  04/19/2016 8:40 AM

## 2016-04-19 NOTE — Patient Instructions (Addendum)
IF you received an x-ray today, you will receive an invoice from [facility]. Please contact [facility] at [phone] with questions or concerns regarding your invoice.   IF you received labwork today, you will receive an invoice from [employer]. Please contact [facility] at [phone] with questions or concerns regarding your invoice.   Our billing staff will not be able to assist you with questions regarding bills from these companies.  You will be contacted with the lab results as soon as they are available. The fastest way to get your results is to activate your My Chart account. Instructions are located on the last page of this paperwork. If you have not heard from [place] regarding the results in 2 weeks, please contact this office.    Start valtrex.  advil or Tylenol over-the-counter as needed. I did write a few hydrocodone if needed for more severe pain. Zoster vaccine can be given in about 6 months.  Return to the clinic or go to the nearest emergency room if any of your symptoms worsen or new symptoms occur.  Shingles Shingles, which is also known as herpes zoster, is an infection that causes a painful skin rash and fluid-filled blisters. Shingles is not related to genital herpes, which is a sexually transmitted infection.   Shingles only develops in people who:  Have had chickenpox.  Have received the chickenpox vaccine. (This is rare.) CAUSES Shingles is caused by varicella-zoster virus (VZV). This is the same virus that causes chickenpox. After exposure to VZV, the virus stays in the body in an inactive (dormant) state. Shingles develops if the virus reactivates. This can happen many years after the initial exposure to VZV. It is not known what causes this virus to reactivate. RISK FACTORS People who have had chickenpox or received the chickenpox vaccine are at risk for shingles. Infection is more common in people who:  Are  older than age 56.  Have a weakened defense (immune) system, such as those with HIV, AIDS, or cancer.  Are taking medicines that weaken the immune system, such as transplant medicines.  Are under great stress. SYMPTOMS Early symptoms of this condition include itching, tingling, and pain in an area on your skin. Pain may be described as burning, stabbing, or throbbing. A few days or weeks after symptoms start, a painful red rash appears, usually on one side of the body in a bandlike or beltlike pattern. The rash eventually turns into fluid-filled blisters that break open, scab over, and dry up in about 2-3 weeks. At any time during the infection, you may also develop:  A fever.  Chills.  A headache.  An upset stomach. DIAGNOSIS This condition is diagnosed with a skin exam. Sometimes, skin or fluid samples are taken from the blisters before a diagnosis is made. These samples are examined under a microscope or sent to a lab for testing. TREATMENT There is no specific cure for this condition. Your health care provider will probably prescribe medicines to help you manage pain, recover more quickly, and avoid long-term problems. Medicines may include:  Antiviral drugs.  Anti-inflammatory drugs.  Pain medicines. If the area involved is on your face, you may be referred to a specialist, such as an eye doctor (ophthalmologist) or an ear, nose, and throat (ENT) doctor to help you avoid eye problems, chronic pain, or disability. HOME CARE INSTRUCTIONS Medicines  Take medicines only as directed by your health care provider.  Apply an anti-itch or numbing cream  to the affected area as directed by your health care provider. Blister and Rash Care  Take a cool bath or apply cool compresses to the area of the rash or blisters as directed by your health care provider. This may help with pain and itching.  Keep your rash covered with a loose bandage (dressing). Wear loose-fitting clothing to  help ease the pain of material rubbing against the rash.  Keep your rash and blisters clean with mild soap and cool water or as directed by your health care provider.  Check your rash every day for signs of infection. These include redness, swelling, and pain that lasts or increases.  Do not pick your blisters.  Do not scratch your rash. General Instructions  Rest as directed by your health care provider.  Keep all follow-up visits as directed by your health care provider. This is important.  Until your blisters scab over, your infection can cause chickenpox in people who have never had it or been vaccinated against it. To prevent this from happening, avoid contact with other people, especially:  Babies.  Pregnant women.  Children who have eczema.  Elderly people who have transplants.  People who have chronic illnesses, such as leukemia or AIDS. SEEK MEDICAL CARE IF:  Your pain is not relieved with prescribed medicines.  Your pain does not get better after the rash heals.  Your rash looks infected. Signs of infection include redness, swelling, and pain that lasts or increases. SEEK IMMEDIATE MEDICAL CARE IF:  The rash is on your face or nose.  You have facial pain, pain around your eye area, or loss of feeling on one side of your face.  You have ear pain or you have ringing in your ear.  You have loss of taste.  Your condition gets worse.   This information is not intended to replace advice given to you by your health care provider. Make sure you discuss any questions you have with your health care provider.   Document Released: 10/21/2005 Document Revised: 11/11/2014 Document Reviewed: 09/01/2014 Elsevier Interactive Patient Education [employer].

## 2016-04-24 ENCOUNTER — Ambulatory Visit: Payer: BLUE CROSS/BLUE SHIELD | Admitting: Physician Assistant

## 2017-04-15 ENCOUNTER — Ambulatory Visit (INDEPENDENT_AMBULATORY_CARE_PROVIDER_SITE_OTHER): Payer: BLUE CROSS/BLUE SHIELD | Admitting: Family Medicine

## 2017-04-15 ENCOUNTER — Encounter: Payer: Self-pay | Admitting: Family Medicine

## 2017-04-15 VITALS — BP 132/88 | HR 68 | Temp 97.8°F | Resp 18 | Ht 73.23 in | Wt 220.0 lb

## 2017-04-15 DIAGNOSIS — E785 Hyperlipidemia, unspecified: Secondary | ICD-10-CM

## 2017-04-15 DIAGNOSIS — J4520 Mild intermittent asthma, uncomplicated: Secondary | ICD-10-CM

## 2017-04-15 DIAGNOSIS — R7303 Prediabetes: Secondary | ICD-10-CM

## 2017-04-15 DIAGNOSIS — Z125 Encounter for screening for malignant neoplasm of prostate: Secondary | ICD-10-CM | POA: Diagnosis not present

## 2017-04-15 DIAGNOSIS — Z0000 Encounter for general adult medical examination without abnormal findings: Secondary | ICD-10-CM

## 2017-04-15 MED ORDER — ALBUTEROL SULFATE HFA 108 (90 BASE) MCG/ACT IN AERS: INHALATION_SPRAY | RESPIRATORY_TRACT | 0 refills | Status: DC

## 2017-04-15 NOTE — Patient Instructions (Addendum)
I will check cholesterol levels today, then depending on numbers can decide if low dose statin may be recommended. If you do start another statin and experience muscle aches - return to discuss those symptoms and plan.   Keeping you healthy  Get these tests  Blood pressure- Have your blood pressure checked once a year by your healthcare provider.  Normal blood pressure is 120/80  Weight- Have your body mass index (BMI) calculated to screen for obesity.  BMI is a measure of body fat based on height and weight. You can also calculate your own BMI at [employer].  Cholesterol- Have your cholesterol checked every year.  Diabetes- Have your blood sugar checked regularly if you have high blood pressure, high cholesterol, have a family history of diabetes or if you are overweight.  Screening for Colon Cancer- Colonoscopy starting at age 81.  Screening may begin sooner depending on your family history and other health conditions. Follow up colonoscopy as directed by your Gastroenterologist.  Screening for Prostate Cancer- Both blood work (PSA) and a rectal exam help screen for Prostate Cancer.  Screening begins at age 34 with African-American men and at age 7 with Caucasian men.  Screening may begin sooner depending on your family history.  Take these medicines  Aspirin- One aspirin daily can help prevent Heart disease and Stroke.  Flu shot- Every fall.  Tetanus- Every 10 years.  Zostavax- Once after the age of 80 to prevent Shingles.  Pneumonia shot- Once after the age of 42; if you are younger than 11, ask your healthcare provider if you need a Pneumonia shot.  Take these steps  Don't smoke- If you do smoke, talk to your doctor about quitting.  For tips on how to quit, go to www.smokefree.gov or call 1-800-QUIT-NOW.  Be physically active- Exercise 5 days a week for at least 30 minutes.  If you are not already physically active start slow and gradually work up to 30 minutes  of moderate physical activity.  Examples of moderate activity include walking briskly, mowing the yard, dancing, swimming, bicycling, etc.  Eat a healthy diet- Eat a variety of healthy food such as fruits, vegetables, low fat milk, low fat cheese, yogurt, lean meant, poultry, fish, beans, tofu, etc. For more information go to www.thenutritionsource.org  Drink alcohol in moderation- Limit alcohol intake to less than two drinks a day. Never drink and drive.  Dentist- Brush and floss twice daily; visit your dentist twice a year.  Depression- Your emotional health is as important as your physical health. If you're feeling down, or losing interest in things you would normally enjoy please talk to your healthcare provider.  Eye exam- Visit your eye doctor every year.  Safe sex- If you may be exposed to a sexually transmitted infection, use a condom.  Seat belts- Seat belts can save your life; always wear one.  Smoke/Carbon Monoxide detectors- These detectors need to be installed on the appropriate level of your home.  Replace batteries at least once a year.  Skin cancer- When out in the sun, cover up and use sunscreen 15 SPF or higher.  Violence- If anyone is threatening you, please tell your healthcare provider.  Living Will/ Health care power of attorney- Speak with your healthcare provider and family.    IF you received an x-ray today, you will receive an invoice from [facility]. Please contact [facility] at [phone] with questions or concerns regarding your invoice.   IF you received labwork today, you will receive  an Pharmacologist from [employer] [facility]. Please contact [facility] at [phone] with questions or concerns regarding your invoice.   Our billing staff will not be able to assist you with questions regarding bills from these companies.  You will be contacted with the lab results as soon as they are available. The fastest way to get your results is to activate your My  Chart account. Instructions are located on the last page of this paperwork. If you have not heard from [place] regarding the results in 2 weeks, please contact this office.

## 2017-04-15 NOTE — Progress Notes (Signed)
   Subjective:    Patient ID: Joel Salas, male    DOB: [birth year], 51 y.o.   MRN: [id number]  HPI    Review of Systems  Constitutional: Negative.   HENT: Negative.   Eyes: Negative.   Respiratory: Negative.   Cardiovascular: Negative.   Gastrointestinal: Negative.   Endocrine: Negative.   Genitourinary: Negative.   Musculoskeletal: Negative.   Skin: Negative.   Allergic/Immunologic: Negative.   Neurological: Negative.   Hematological: Negative.   Psychiatric/Behavioral: Negative.        Objective:   Physical Exam        Assessment & Plan:

## 2017-04-15 NOTE — Progress Notes (Signed)
Subjective:  By signing my name below, I, Essence Howell, attest that this documentation has been prepared under the direction and in the presence of Wendie Agreste, MD Electronically Signed: Ladene Artist, ED Scribe 04/15/2017 at 9:21 AM.   Patient ID: Joel Salas, male    DOB: 1965/12/11, 51 y.o.   MRN: [id number]  Chief Complaint  Patient presents with  . Annual Exam    HLD and Anxiety   HPI Joel Salas is a 51 y.o. male who presents to Primary Care at [facility] for an annual exam. Pt is fasting at this visit. Pt checks his BP at home with average readings of 116-125/70-82.   Asthma Has flare-ups in the fall, winter and and spring when the flowers begin to bloom. Pt takes albuterol inhaler  at home twice a day for ~10 days-2 weeks during flare-ups. Requests a refill at this visit. Pt also takes Zyrtec to treat environmental allergies.  CA Screening Colonoscopy: 12/2015 by Dr. Fuller Plan, multiple polyps. Mild diverticulitis. Hyperplastic poylps. Repeat in 2-3 years. Prostate CA: No family h/o prostate CA.  Lab Results  Component Value Date   PSA 1.65 10/25/2015   PSA 1.07 09/29/2013   Immunizations Immunization History  Administered Date(s) Administered  . Influenza,inj,Quad PF,36+ Mos 09/29/2013, 08/03/2014, 09/06/2015  . Tdap 02/03/2008   Depression Screening Depression screen [facility] 2/9 04/15/2017 04/19/2016 11/28/2015 10/25/2015 09/06/2015  Decreased Interest 0 0 0 0 0  Down, Depressed, Hopeless 0 0 0 0 0  PHQ - 2 Score 0 0 0 0 0     Visual Acuity Screening   Right eye Left eye Both eyes  Without correction:     With correction: 20/25 20/25 20/20   Exercise: Pt bikes ~60 miles/week  Dentist: Pt is followed by [facility] Vision: Followed by eye doctor annually; last visit was August 2017.  Hyperlipidemia Lab Results  Component Value Date   CHOL 232 (H) 10/25/2015   HDL 44 10/25/2015   LDLCALC 163 (H) 10/25/2015   TRIG 124 10/25/2015   CHOLHDL 5.3 (H)  10/25/2015   Lab Results  Component Value Date   ALT 30 11/28/2015   AST 19 11/28/2015   ALKPHOS 51 11/28/2015   BILITOT 0.6 11/28/2015  Previously on Zocor than Lipitor. Muscle cramping on statins then possible syncope with statin versus BP medication. Also noticed on visit in 2017 that Paxil may have caused syncopal episode. January 2017 reccommended trial off of Lipitor for weeks with repeat lipid panel in 4-6 months off statin. He does not recall which medication caused worsened cramping.   Pre-Diabetes  Lab Results  Component Value Date   HGBA1C 6.0 (H) 10/25/2015   Anxiety Previously treated with Paxil when his bother passed. Dose decreased in January 2017 to 10 mg after reported dizziness and syncope. Pt states that he thinks biking ~60 miles/week is actually more therapeutic for him than Paxil was. Does not think he needs to restart medication at this visit.   Syncope  Referred to cardiology and neurology for further evaluation in January of last year. See details of last office visit with Dr. Brigitte Pulse. Seen by neurology in February 2017. No further work-up needed. Thought to be due to dehydration. Cardiology eval February 6; recommended avoid caffeine, alcohol, increasing salts and fluid intake. Echocardiogram withy normal lv function and mild diastolic dysfunction.   Patient Active Problem List   Diagnosis Date Noted  . Syncope 12/11/2015  . H/O cervical spine surgery-2011 09/29/2013  . Hyperlipidemia 07/01/2012  .  Elevated BP 07/01/2012  . BMI 30.0-30.9,adult 07/01/2012   Past Medical History:  Diagnosis Date  . Allergy   . Anxiety   . Arthritis   . Asthma    allery induced asthma  . Hyperlipidemia    meds on hold- borderline   Past Surgical History:  Procedure Laterality Date  . SPINE SURGERY    . TONSILLECTOMY    . WISDOM TOOTH EXTRACTION     No Known Allergies Prior to Admission medications   Medication Sig Start Date End Date Taking? Authorizing Provider    cetirizine (ZYRTEC) 10 MG chewable tablet Chew 10 mg by mouth daily. Reported on 12/22/2015    [provider]  ibuprofen (ADVIL,MOTRIN) 200 MG tablet Take 200 mg by mouth every 8 (eight) hours as needed.    [provider]  VENTOLIN HFA 108 (90 Base) MCG/ACT inhaler INHALE 2 PUFFS INTO THE LUNGS EVERY 4 HOURS AS NEEDED 12/16/15   Leandrew Koyanagi, MD   Social History   Social History  . Marital status: Single    Spouse name: N/A  . Number of children: N/A  . Years of education: N/A   Occupational History  . Environmental health practitioner   Social History Main Topics  . Smoking status: Former Smoker    Quit date: 09/06/2002  . Smokeless tobacco: Former Systems developer  . Alcohol use 7.2 oz/week    12 Cans of beer per week     Comment: 12-15 per week   . Drug use: No  . Sexual activity: Yes   Other Topics Concern  . Not on file   Social History Narrative   Single. Education: college.      Patient drinks about 8 cups of caffeine daily.   Patient is ambidextrous.    Review of Systems  All other systems reviewed and are negative.     Objective:   Physical Exam  Constitutional: He is oriented to person, place, and time. He appears well-developed and well-nourished.  HENT:  Head: Normocephalic and atraumatic.  Right Ear: External ear normal.  Left Ear: External ear normal.  Mouth/Throat: Oropharynx is clear and moist.  Eyes: Conjunctivae and EOM are normal. Pupils are equal, round, and reactive to light.  Neck: Normal range of motion. Neck supple. No thyromegaly present.  Cardiovascular: Normal rate, regular rhythm, normal heart sounds and intact distal pulses.   Pulmonary/Chest: Effort normal and breath sounds normal. No respiratory distress. He has no wheezes.  Abdominal: Soft. He exhibits no distension. There is no tenderness. Hernia confirmed negative in the right inguinal area and confirmed negative in the left inguinal area.   Genitourinary: Prostate normal.  Musculoskeletal: Normal range of motion. He exhibits no edema or tenderness.  Lymphadenopathy:    He has no cervical adenopathy.  Neurological: He is alert and oriented to person, place, and time. He has normal reflexes.  Skin: Skin is warm and dry.  Psychiatric: He has a normal mood and affect. His behavior is normal.  Vitals reviewed.     Vitals:   04/15/17 0839 04/15/17 0901  BP: (!) 146/85 132/88  Pulse: 68   Resp: 18   Temp: 97.8 F (36.6 C)   TempSrc: Oral   SpO2: 97%   Weight: 220 lb (99.8 kg)   Height: 6' 1.23" (1.86 m)       Assessment & Plan:   Joel Salas is a 51 y.o. male Annual physical exam  - -anticipatory guidance as below in AVS, screening labs  above. Health maintenance items as above in HPI discussed/recommended as applicable.   Hyperlipidemia, unspecified hyperlipidemia type - Plan: Comprehensive metabolic panel, Lipid panel  - check lipids, then decide on statin once ASCVD risk assessed.  If statins restarted, watch for return of myalgias.  Mild intermittent asthma without complication - Plan: albuterol (VENTOLIN HFA) 108 (90 Base) MCG/ACT inhaler  - Stable, continue albuterol as needed. RTC precautions if persistent/frequent use as may need inhaled corticosteroid temporarily.  Prediabetes - Plan: Hemoglobin A1c  Screening for prostate cancer - Plan: PSA  -We discussed pros and cons of prostate cancer screening, and after this discussion, he chose to have screening done. PSA obtained, and no concerning findings on DRE.    Meds ordered this encounter  Medications  . albuterol (VENTOLIN HFA) 108 (90 Base) MCG/ACT inhaler    Sig: INHALE 2 PUFFS INTO THE LUNGS EVERY 4 HOURS AS NEEDED    Dispense:  18 g    Refill:  0   Patient Instructions   I will check cholesterol levels today, then depending on numbers can decide if low dose statin may be recommended. If you do start another statin and experience muscle  aches - return to discuss those symptoms and plan.   Keeping you healthy  Get these tests  Blood pressure- Have your blood pressure checked once a year by your healthcare provider.  Normal blood pressure is 120/80  Weight- Have your body mass index (BMI) calculated to screen for obesity.  BMI is a measure of body fat based on height and weight. You can also calculate your own BMI at ViewBanking.si.  Cholesterol- Have your cholesterol checked every year.  Diabetes- Have your blood sugar checked regularly if you have high blood pressure, high cholesterol, have a family history of diabetes or if you are overweight.  Screening for Colon Cancer- Colonoscopy starting at age 59.  Screening may begin sooner depending on your family history and other health conditions. Follow up colonoscopy as directed by your Gastroenterologist.  Screening for Prostate Cancer- Both blood work (PSA) and a rectal exam help screen for Prostate Cancer.  Screening begins at age 65 with African-American men and at age 98 with Caucasian men.  Screening may begin sooner depending on your family history.  Take these medicines  Aspirin- One aspirin daily can help prevent Heart disease and Stroke.  Flu shot- Every fall.  Tetanus- Every 10 years.  Zostavax- Once after the age of 35 to prevent Shingles.  Pneumonia shot- Once after the age of 56; if you are younger than 1, ask your healthcare provider if you need a Pneumonia shot.  Take these steps  Don't smoke- If you do smoke, talk to your doctor about quitting.  For tips on how to quit, go to www.smokefree.gov or call 1-800-QUIT-NOW.  Be physically active- Exercise 5 days a week for at least 30 minutes.  If you are not already physically active start slow and gradually work up to 30 minutes of moderate physical activity.  Examples of moderate activity include walking briskly, mowing the yard, dancing, swimming, bicycling, etc.  Eat a healthy diet- Eat a  variety of healthy food such as fruits, vegetables, low fat milk, low fat cheese, yogurt, lean meant, poultry, fish, beans, tofu, etc. For more information go to www.thenutritionsource.org  Drink alcohol in moderation- Limit alcohol intake to less than two drinks a day. Never drink and drive.  Dentist- Brush and floss twice daily; visit your dentist twice a year.  Depression-  Your emotional health is as important as your physical health. If you're feeling down, or losing interest in things you would normally enjoy please talk to your healthcare provider.  Eye exam- Visit your eye doctor every year.  Safe sex- If you may be exposed to a sexually transmitted infection, use a condom.  Seat belts- Seat belts can save your life; always wear one.  Smoke/Carbon Monoxide detectors- These detectors need to be installed on the appropriate level of your home.  Replace batteries at least once a year.  Skin cancer- When out in the sun, cover up and use sunscreen 15 SPF or higher.  Violence- If anyone is threatening you, please tell your healthcare provider.  Living Will/ Health care power of attorney- Speak with your healthcare provider and family.    IF you received an x-ray today, you will receive an invoice from [facility]. Please contact [facility] at [phone] with questions or concerns regarding your invoice.   IF you received labwork today, you will receive an invoice from [facility]. Please contact [facility] at [phone] with questions or concerns regarding your invoice.   Our billing staff will not be able to assist you with questions regarding bills from these companies.  You will be contacted with the lab results as soon as they are available. The fastest way to get your results is to activate your My Chart account. Instructions are located on the last page of this paperwork. If you have not heard from [place] regarding the results in 2 weeks, please contact this  office.       I personally performed the services described in this documentation, which was scribed in my presence. The recorded information has been reviewed and considered for accuracy and completeness, addended by me as needed, and agree with information above.  Signed,   Merri Ray, MD [facility] at [place].  04/16/17 11:48 AM

## 2017-04-16 LAB — COMPREHENSIVE METABOLIC PANEL
ALT: 41 IU/L (ref 0–44)
AST: 28 IU/L (ref 0–40)
Albumin/Globulin Ratio: 2.1 (ref 1.2–2.2)
Albumin: 4.7 g/dL (ref 3.5–5.5)
Alkaline Phosphatase: 51 IU/L (ref 39–117)
BUN/Creatinine Ratio: 11 (ref 9–20)
BUN: 12 mg/dL (ref 6–24)
Bilirubin Total: 0.6 mg/dL (ref 0.0–1.2)
CO2: 23 mmol/L (ref 20–29)
Calcium: 10.1 mg/dL (ref 8.7–10.2)
Chloride: 105 mmol/L (ref 96–106)
Creatinine, Ser: 1.11 mg/dL (ref 0.76–1.27)
GFR calc Af Amer: 88 mL/min/{1.73_m2} (ref >59)
GFR calc non Af Amer: 76 mL/min/{1.73_m2} (ref >59)
Globulin, Total: 2.2 g/dL (ref 1.5–4.5)
Glucose: 113 mg/dL — ABNORMAL HIGH (ref 65–99)
Potassium: 4.5 mmol/L (ref 3.5–5.2)
Sodium: 141 mmol/L (ref 134–144)
Total Protein: 6.9 g/dL (ref 6.0–8.5)

## 2017-04-16 LAB — LIPID PANEL
Chol/HDL Ratio: 4.2 ratio (ref 0.0–5.0)
Cholesterol, Total: 217 mg/dL — ABNORMAL HIGH (ref 100–199)
HDL: 52 mg/dL (ref >39)
LDL Calculated: 144 mg/dL — ABNORMAL HIGH (ref 0–99)
Triglycerides: 103 mg/dL (ref 0–149)
VLDL Cholesterol Cal: 21 mg/dL (ref 5–40)

## 2017-04-16 LAB — PSA: Prostate Specific Ag, Serum: 1.1 ng/mL (ref 0.0–4.0)

## 2017-04-16 LAB — HEMOGLOBIN A1C
Est. average glucose Bld gHb Est-mCnc: 117 mg/dL
Hgb A1c MFr Bld: 5.7 % — ABNORMAL HIGH (ref 4.8–5.6)

## 2017-06-23 ENCOUNTER — Emergency Department (HOSPITAL_COMMUNITY): Payer: BLUE CROSS/BLUE SHIELD | Admitting: Certified Registered Nurse Anesthetist

## 2017-06-23 ENCOUNTER — Inpatient Hospital Stay (HOSPITAL_COMMUNITY)
Admission: EM | Admit: 2017-06-23 | Discharge: 2017-06-25 | DRG: 909 | Disposition: A | Discharge status: Home / Self Care | Payer: BLUE CROSS/BLUE SHIELD | Attending: Orthopedic Surgery | Admitting: Orthopedic Surgery

## 2017-06-23 ENCOUNTER — Encounter (HOSPITAL_COMMUNITY)
Admission: EM | Disposition: A | Discharge status: Home / Self Care | Payer: Self-pay | Source: Home / Self Care | Attending: Orthopedic Surgery

## 2017-06-23 ENCOUNTER — Encounter (HOSPITAL_COMMUNITY): Payer: Self-pay | Admitting: *Deleted

## 2017-06-23 ENCOUNTER — Emergency Department (HOSPITAL_COMMUNITY): Payer: BLUE CROSS/BLUE SHIELD

## 2017-06-23 DIAGNOSIS — E785 Hyperlipidemia, unspecified: Secondary | ICD-10-CM | POA: Diagnosis present

## 2017-06-23 DIAGNOSIS — Z23 Encounter for immunization: Secondary | ICD-10-CM | POA: Diagnosis not present

## 2017-06-23 DIAGNOSIS — F17200 Nicotine dependence, unspecified, uncomplicated: Secondary | ICD-10-CM | POA: Diagnosis not present

## 2017-06-23 DIAGNOSIS — T148XXA Other injury of unspecified body region, initial encounter: Secondary | ICD-10-CM | POA: Diagnosis not present

## 2017-06-23 DIAGNOSIS — J45909 Unspecified asthma, uncomplicated: Secondary | ICD-10-CM | POA: Diagnosis not present

## 2017-06-23 DIAGNOSIS — S81822A Laceration with foreign body, left lower leg, initial encounter: Secondary | ICD-10-CM | POA: Diagnosis not present

## 2017-06-23 DIAGNOSIS — Z820 Family history of epilepsy and other diseases of the nervous system: Secondary | ICD-10-CM | POA: Diagnosis not present

## 2017-06-23 DIAGNOSIS — S81812A Laceration without foreign body, left lower leg, initial encounter: Secondary | ICD-10-CM | POA: Diagnosis present

## 2017-06-23 DIAGNOSIS — S199XXA Unspecified injury of neck, initial encounter: Secondary | ICD-10-CM | POA: Diagnosis not present

## 2017-06-23 DIAGNOSIS — Z833 Family history of diabetes mellitus: Secondary | ICD-10-CM

## 2017-06-23 DIAGNOSIS — M79605 Pain in left leg: Secondary | ICD-10-CM | POA: Diagnosis not present

## 2017-06-23 DIAGNOSIS — S0990XA Unspecified injury of head, initial encounter: Secondary | ICD-10-CM | POA: Diagnosis not present

## 2017-06-23 HISTORY — PX: OTHER SURGICAL HISTORY: SHX169

## 2017-06-23 HISTORY — PX: I & D EXTREMITY: SHX5045

## 2017-06-23 HISTORY — DX: Syncope and collapse: R55

## 2017-06-23 LAB — I-STAT CHEM 8, ED
BUN: 16 mg/dL (ref 6–20)
Calcium, Ion: 1.06 mmol/L — ABNORMAL LOW (ref 1.15–1.40)
Chloride: 105 mmol/L (ref 101–111)
Creatinine, Ser: 1.3 mg/dL — ABNORMAL HIGH (ref 0.61–1.24)
Glucose, Bld: 143 mg/dL — ABNORMAL HIGH (ref 65–99)
HCT: 49 % (ref 39.0–52.0)
Hemoglobin: 16.7 g/dL (ref 13.0–17.0)
Potassium: 3.6 mmol/L (ref 3.5–5.1)
Sodium: 140 mmol/L (ref 135–145)
TCO2: 20 mmol/L (ref 0–100)

## 2017-06-23 LAB — COMPREHENSIVE METABOLIC PANEL
ALT: 44 U/L (ref 17–63)
AST: 36 U/L (ref 15–41)
Albumin: 4.5 g/dL (ref 3.5–5.0)
Alkaline Phosphatase: 42 U/L (ref 38–126)
Anion gap: 15 (ref 5–15)
BUN: 13 mg/dL (ref 6–20)
CO2: 20 mmol/L — ABNORMAL LOW (ref 22–32)
Calcium: 9.6 mg/dL (ref 8.9–10.3)
Chloride: 104 mmol/L (ref 101–111)
Creatinine, Ser: 1.55 mg/dL — ABNORMAL HIGH (ref 0.61–1.24)
GFR calc Af Amer: 58 mL/min — ABNORMAL LOW (ref >60)
GFR calc non Af Amer: 50 mL/min — ABNORMAL LOW (ref >60)
Glucose, Bld: 146 mg/dL — ABNORMAL HIGH (ref 65–99)
Potassium: 3.6 mmol/L (ref 3.5–5.1)
Sodium: 139 mmol/L (ref 135–145)
Total Bilirubin: 1.4 mg/dL — ABNORMAL HIGH (ref 0.3–1.2)
Total Protein: 7 g/dL (ref 6.5–8.1)

## 2017-06-23 LAB — I-STAT CG4 LACTIC ACID, ED
Lactic Acid, Venous: 4.47 mmol/L (ref 0.5–1.9)
Lactic Acid, Venous: 0.99 mmol/L (ref 0.5–1.9)

## 2017-06-23 LAB — CBC
HCT: 47.5 % (ref 39.0–52.0)
Hemoglobin: 16.2 g/dL (ref 13.0–17.0)
MCH: 30.7 pg (ref 26.0–34.0)
MCHC: 34.1 g/dL (ref 30.0–36.0)
MCV: 90 fL (ref 78.0–100.0)
Platelets: 237 10*3/uL (ref 150–400)
RBC: 5.28 MIL/uL (ref 4.22–5.81)
RDW: 13.1 % (ref 11.5–15.5)
WBC: 9.3 10*3/uL (ref 4.0–10.5)

## 2017-06-23 LAB — SAMPLE TO BLOOD BANK

## 2017-06-23 LAB — CDS SEROLOGY

## 2017-06-23 LAB — PROTIME-INR
INR: 0.91
Prothrombin Time: 12.3 seconds (ref 11.4–15.2)

## 2017-06-23 LAB — ETHANOL: Alcohol, Ethyl (B): <5 mg/dL (ref <5)

## 2017-06-23 SURGERY — IRRIGATION AND DEBRIDEMENT EXTREMITY
Anesthesia: General | Laterality: Left

## 2017-06-23 MED ORDER — SUCCINYLCHOLINE 20MG/ML (10ML) SYRINGE FOR MEDFUSION PUMP - OPTIME
INTRAMUSCULAR | Status: DC | PRN
  Administered 2017-06-23: 140 mg via INTRAVENOUS

## 2017-06-23 MED ORDER — SUFENTANIL CITRATE 50 MCG/ML IV SOLN
INTRAVENOUS | Status: AC
  Filled 2017-06-23: qty 1

## 2017-06-23 MED ORDER — CEFAZOLIN SODIUM-DEXTROSE 2-4 GM/100ML-% IV SOLN
2.0000 g | INTRAVENOUS | Status: AC
  Administered 2017-06-23: 2 g via INTRAVENOUS

## 2017-06-23 MED ORDER — ONDANSETRON HCL 4 MG/2ML IJ SOLN
4.0000 mg | Freq: Once | INTRAMUSCULAR | Status: AC
  Administered 2017-06-23: 4 mg via INTRAVENOUS
  Filled 2017-06-23: qty 2

## 2017-06-23 MED ORDER — FENTANYL CITRATE (PF) 250 MCG/5ML IJ SOLN
INTRAMUSCULAR | Status: DC | PRN
  Administered 2017-06-23: 100 ug via INTRAVENOUS
  Administered 2017-06-23: 50 ug via INTRAVENOUS
  Administered 2017-06-23: 100 ug via INTRAVENOUS

## 2017-06-23 MED ORDER — SODIUM CHLORIDE 0.9 % IV SOLN
INTRAVENOUS | Status: AC | PRN
  Administered 2017-06-23: 1000 mL via INTRAVENOUS

## 2017-06-23 MED ORDER — MIDAZOLAM HCL 2 MG/2ML IJ SOLN
INTRAMUSCULAR | Status: AC
  Filled 2017-06-23: qty 2

## 2017-06-23 MED ORDER — POVIDONE-IODINE 10 % EX SWAB
2.0000 "application " | Freq: Once | CUTANEOUS | Status: AC
  Administered 2017-06-23: 2 via TOPICAL

## 2017-06-23 MED ORDER — ONDANSETRON HCL 4 MG/2ML IJ SOLN
INTRAMUSCULAR | Status: AC
  Filled 2017-06-23: qty 2

## 2017-06-23 MED ORDER — ONDANSETRON HCL 4 MG/2ML IJ SOLN: 4.0000 mg | Freq: Once | INTRAMUSCULAR | Status: DC | PRN

## 2017-06-23 MED ORDER — LIDOCAINE 2% (20 MG/ML) 5 ML SYRINGE
INTRAMUSCULAR | Status: AC
  Filled 2017-06-23: qty 5

## 2017-06-23 MED ORDER — SUCCINYLCHOLINE CHLORIDE 200 MG/10ML IV SOSY
PREFILLED_SYRINGE | INTRAVENOUS | Status: AC
  Filled 2017-06-23: qty 10

## 2017-06-23 MED ORDER — PHENYLEPHRINE 40 MCG/ML (10ML) SYRINGE FOR IV PUSH (FOR BLOOD PRESSURE SUPPORT)
PREFILLED_SYRINGE | INTRAVENOUS | Status: AC
  Filled 2017-06-23: qty 10

## 2017-06-23 MED ORDER — HYDROMORPHONE HCL 1 MG/ML IJ SOLN
INTRAMUSCULAR | Status: AC | PRN
  Administered 2017-06-23: 1 mg via INTRAVENOUS

## 2017-06-23 MED ORDER — CEFAZOLIN SODIUM-DEXTROSE 2-4 GM/100ML-% IV SOLN
INTRAVENOUS | Status: AC
  Administered 2017-06-23: 2000 mg via INTRAVENOUS
  Filled 2017-06-23: qty 100

## 2017-06-23 MED ORDER — SODIUM CHLORIDE 0.9 % IR SOLN
Status: DC | PRN
  Administered 2017-06-23: 1000 mL

## 2017-06-23 MED ORDER — FENTANYL CITRATE (PF) 250 MCG/5ML IJ SOLN
INTRAMUSCULAR | Status: AC
  Filled 2017-06-23: qty 5

## 2017-06-23 MED ORDER — HYDROMORPHONE HCL 1 MG/ML IJ SOLN
1.0000 mg | Freq: Once | INTRAMUSCULAR | Status: AC
  Administered 2017-06-23: 1 mg via INTRAVENOUS
  Filled 2017-06-23: qty 1

## 2017-06-23 MED ORDER — DEXAMETHASONE SODIUM PHOSPHATE 10 MG/ML IJ SOLN
INTRAMUSCULAR | Status: DC | PRN
  Administered 2017-06-23: 10 mg via INTRAVENOUS

## 2017-06-23 MED ORDER — SODIUM CHLORIDE 0.9 % IV SOLN: Freq: Once | INTRAVENOUS | Status: DC

## 2017-06-23 MED ORDER — DEXAMETHASONE SODIUM PHOSPHATE 10 MG/ML IJ SOLN
INTRAMUSCULAR | Status: AC
  Filled 2017-06-23: qty 1

## 2017-06-23 MED ORDER — EPHEDRINE SULFATE 50 MG/ML IJ SOLN
INTRAMUSCULAR | Status: DC | PRN
  Administered 2017-06-23 (×3): 10 mg via INTRAVENOUS

## 2017-06-23 MED ORDER — EPHEDRINE 5 MG/ML INJ
INTRAVENOUS | Status: AC
  Filled 2017-06-23: qty 10

## 2017-06-23 MED ORDER — KETOROLAC TROMETHAMINE 30 MG/ML IJ SOLN
INTRAMUSCULAR | Status: AC
  Filled 2017-06-23: qty 1

## 2017-06-23 MED ORDER — HYDROMORPHONE HCL 1 MG/ML IJ SOLN
0.2500 mg | INTRAMUSCULAR | Status: DC | PRN
  Administered 2017-06-23 (×2): 0.5 mg via INTRAVENOUS

## 2017-06-23 MED ORDER — PROPOFOL 10 MG/ML IV BOLUS
INTRAVENOUS | Status: DC | PRN
  Administered 2017-06-23: 80 mg via INTRAVENOUS
  Administered 2017-06-23: 120 mg via INTRAVENOUS

## 2017-06-23 MED ORDER — HYDROMORPHONE HCL 1 MG/ML IJ SOLN
INTRAMUSCULAR | Status: AC
  Filled 2017-06-23: qty 1

## 2017-06-23 MED ORDER — LIDOCAINE HCL (CARDIAC) 20 MG/ML IV SOLN
INTRAVENOUS | Status: DC | PRN
  Administered 2017-06-23: 100 mg via INTRATRACHEAL

## 2017-06-23 MED ORDER — CHLORHEXIDINE GLUCONATE 4 % EX LIQD: 60.0000 mL | Freq: Once | CUTANEOUS | Status: DC

## 2017-06-23 MED ORDER — ONDANSETRON HCL 4 MG/2ML IJ SOLN
INTRAMUSCULAR | Status: DC | PRN
  Administered 2017-06-23: 4 mg via INTRAVENOUS

## 2017-06-23 MED ORDER — CEFAZOLIN SODIUM-DEXTROSE 2-4 GM/100ML-% IV SOLN
INTRAVENOUS | Status: AC
  Filled 2017-06-23: qty 100

## 2017-06-23 MED ORDER — SODIUM CHLORIDE 0.9 % IV BOLUS (SEPSIS)
1000.0000 mL | Freq: Once | INTRAVENOUS | Status: AC
  Administered 2017-06-23: 1000 mL via INTRAVENOUS

## 2017-06-23 MED ORDER — MIDAZOLAM HCL 2 MG/2ML IJ SOLN
INTRAMUSCULAR | Status: DC | PRN
  Administered 2017-06-23: 2 mg via INTRAVENOUS

## 2017-06-23 MED ORDER — FENTANYL CITRATE (PF) 100 MCG/2ML IJ SOLN
INTRAMUSCULAR | Status: AC
  Filled 2017-06-23: qty 2

## 2017-06-23 MED ORDER — SUFENTANIL CITRATE 50 MCG/ML IV SOLN
INTRAVENOUS | Status: DC | PRN
  Administered 2017-06-23: 10 ug via INTRAVENOUS

## 2017-06-23 MED ORDER — LACTATED RINGERS IV SOLN
Freq: Once | INTRAVENOUS | Status: AC
  Administered 2017-06-23: 19:00:00 via INTRAVENOUS

## 2017-06-23 MED ORDER — LACTATED RINGERS IV SOLN
INTRAVENOUS | Status: DC | PRN
  Administered 2017-06-23 (×2): via INTRAVENOUS

## 2017-06-23 MED ORDER — TETANUS-DIPHTH-ACELL PERTUSSIS 5-2.5-18.5 LF-MCG/0.5 IM SUSP
INTRAMUSCULAR | Status: AC
  Administered 2017-06-23: 0.5 mL via INTRAMUSCULAR
  Filled 2017-06-23: qty 0.5

## 2017-06-23 MED ORDER — PROPOFOL 10 MG/ML IV BOLUS
INTRAVENOUS | Status: AC
  Filled 2017-06-23: qty 20

## 2017-06-23 MED ORDER — MEPERIDINE HCL 25 MG/ML IJ SOLN: 6.2500 mg | INTRAMUSCULAR | Status: DC | PRN

## 2017-06-23 MED ORDER — CEFAZOLIN SODIUM-DEXTROSE 2-4 GM/100ML-% IV SOLN
2.0000 g | INTRAVENOUS | Status: AC
  Administered 2017-06-23: 2000 mg via INTRAVENOUS

## 2017-06-23 SURGICAL SUPPLY — 50 items
BANDAGE ACE 4X5 VEL STRL LF (GAUZE/BANDAGES/DRESSINGS) ×2 IMPLANT
BANDAGE ACE 6X5 VEL STRL LF (GAUZE/BANDAGES/DRESSINGS) ×1 IMPLANT
BNDG GAUZE ELAST 4 BULKY (GAUZE/BANDAGES/DRESSINGS) ×2 IMPLANT
CUFF TOURNIQUET SINGLE 18IN (TOURNIQUET CUFF) ×2 IMPLANT
CUFF TOURNIQUET SINGLE 24IN (TOURNIQUET CUFF) IMPLANT
CUFF TOURNIQUET SINGLE 34IN LL (TOURNIQUET CUFF) IMPLANT
CUFF TOURNIQUET SINGLE 44IN (TOURNIQUET CUFF) IMPLANT
DRAPE SURG 17X23 STRL (DRAPES) ×2 IMPLANT
DRAPE U-SHAPE 47X51 STRL (DRAPES) ×2 IMPLANT
DRSG EMULSION OIL 3X3 NADH (GAUZE/BANDAGES/DRESSINGS) ×2 IMPLANT
DRSG PAD ABDOMINAL 8X10 ST (GAUZE/BANDAGES/DRESSINGS) ×2 IMPLANT
ELECT REM PT RETURN 9FT ADLT (ELECTROSURGICAL) ×2
ELECTRODE REM PT RTRN 9FT ADLT (ELECTROSURGICAL) IMPLANT
FACESHIELD WRAPAROUND (MASK) ×2 IMPLANT
FACESHIELD WRAPAROUND OR TEAM (MASK) ×1 IMPLANT
GAUZE SPONGE 4X4 12PLY STRL (GAUZE/BANDAGES/DRESSINGS) ×2 IMPLANT
GAUZE XEROFORM 5X9 LF (GAUZE/BANDAGES/DRESSINGS) ×1 IMPLANT
GLOVE BIOGEL PI IND STRL 7.5 (GLOVE) ×1 IMPLANT
GLOVE BIOGEL PI IND STRL 8 (GLOVE) ×1 IMPLANT
GLOVE BIOGEL PI INDICATOR 7.5 (GLOVE) ×1
GLOVE BIOGEL PI INDICATOR 8 (GLOVE) ×1
GLOVE ECLIPSE 8.0 STRL XLNG CF (GLOVE) ×2 IMPLANT
GLOVE ORTHO TXT STRL SZ7.5 (GLOVE) ×2 IMPLANT
GLOVE SURG ORTHO 8.0 STRL STRW (GLOVE) ×2 IMPLANT
GOWN STRL REIN 3XL XLG LVL4 (GOWN DISPOSABLE) ×2 IMPLANT
GOWN STRL REUS W/ TWL LRG LVL3 (GOWN DISPOSABLE) ×3 IMPLANT
GOWN STRL REUS W/TWL LRG LVL3 (GOWN DISPOSABLE) ×6
HANDPIECE INTERPULSE COAX TIP (DISPOSABLE) ×2
KIT BASIN OR (CUSTOM PROCEDURE TRAY) ×2 IMPLANT
KIT ROOM TURNOVER OR (KITS) ×2 IMPLANT
MANIFOLD NEPTUNE II (INSTRUMENTS) ×2 IMPLANT
NS IRRIG 1000ML POUR BTL (IV SOLUTION) ×2 IMPLANT
PACK ORTHO EXTREMITY (CUSTOM PROCEDURE TRAY) ×2 IMPLANT
PAD ABD 8X10 STRL (GAUZE/BANDAGES/DRESSINGS) ×1 IMPLANT
PAD ARMBOARD 7.5X6 YLW CONV (MISCELLANEOUS) ×4 IMPLANT
SET HNDPC FAN SPRY TIP SCT (DISPOSABLE) IMPLANT
SPONGE LAP 18X18 X RAY DECT (DISPOSABLE) ×2 IMPLANT
SPONGE LAP 4X18 X RAY DECT (DISPOSABLE) ×2 IMPLANT
STOCKINETTE IMPERVIOUS 9X36 MD (GAUZE/BANDAGES/DRESSINGS) ×2 IMPLANT
SUCTION FRAZIER HANDLE 10FR (MISCELLANEOUS)
SUCTION TUBE FRAZIER 10FR DISP (MISCELLANEOUS) IMPLANT
SUT ETHILON 3 0 PS 1 (SUTURE) ×2 IMPLANT
SUT SILK 2 0 FS (SUTURE) ×2 IMPLANT
SWAB CULTURE ESWAB REG 1ML (MISCELLANEOUS) IMPLANT
TOWEL OR 17X24 6PK STRL BLUE (TOWEL DISPOSABLE) ×2 IMPLANT
TOWEL OR 17X26 10 PK STRL BLUE (TOWEL DISPOSABLE) ×2 IMPLANT
TUBE CONNECTING 12X1/4 (SUCTIONS) ×2 IMPLANT
UNDERPAD 30X30 (UNDERPADS AND DIAPERS) ×2 IMPLANT
WATER STERILE IRR 1000ML POUR (IV SOLUTION) ×2 IMPLANT
YANKAUER SUCT BULB TIP NO VENT (SUCTIONS) ×2 IMPLANT

## 2017-06-23 NOTE — Transfer of Care (Signed)
Immediate Anesthesia Transfer of Care Note  Patient: Joel Salas  Procedure(s) Performed: Procedure(s): IRRIGATION AND DEBRIDEMENT LEFT LOWER LEG WOUND POSSIBLE CLOSURE (Left)  Patient Location: PACU  Anesthesia Type:General  Level of Consciousness: awake, alert , oriented and patient cooperative  Airway & Oxygen Therapy: Patient Spontanous Breathing  Post-op Assessment: Report given to RN, Post -op Vital signs reviewed and stable and Patient moving all extremities X 4  Post vital signs: Reviewed and stable  Last Vitals:  Vitals:   06/23/17 1715 06/23/17 1800  BP: (!) 151/87 (!) 154/87  Pulse: 80 67  Resp: 13 15  SpO2: 96% 97%    Last Pain:  Vitals:   06/23/17 1340  PainSc: 10-Worst pain ever         Complications: No apparent anesthesia complications
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
## 2017-06-23 NOTE — Anesthesia Procedure Notes (Addendum)
Procedure Name: Intubation Date/Time: 06/23/2017 7:28 PM Performed by: Claris Che Pre-anesthesia Checklist: Patient identified, Emergency Drugs available, Suction available, Patient being monitored and Timeout performed Patient Re-evaluated:Patient Re-evaluated prior to induction Oxygen Delivery Method: Circle system utilized Preoxygenation: Pre-oxygenation with 100% oxygen Induction Type: IV induction, Rapid sequence and Cricoid Pressure applied Laryngoscope Size: Mac and 3 Grade View: Grade II Tube type: Oral Tube size: 7.5 mm Number of attempts: 1 Airway Equipment and Method: Stylet Placement Confirmation: ETT inserted through vocal cords under direct vision,  positive ETCO2 and breath sounds checked- equal and bilateral Secured at: 24 cm Tube secured with: Tape Dental Injury: Teeth and Oropharynx as per pre-operative assessment

## 2017-06-23 NOTE — Brief Op Note (Signed)
06/23/2017  9:15 PM  PATIENT:  Joel Salas  51 y.o. male  PRE-OPERATIVE DIAGNOSIS:  Complex left leg lacerations  POST-OPERATIVE DIAGNOSIS:  Complex left leg lacerations  PROCEDURE:  Procedure(s): Excisional and non excisional debridement of left leg laceration with primary wound closure, complex 10-12 inch curvilinear primary laceration 3 other lacerations 2-4 inches in length  SURGEON:  Surgeon(s) and Role:    Paralee Cancel, MD - Primary  PHYSICIAN ASSISTANT: None   ANESTHESIA:   general  EBL:  Total I/O In: 1600 [I.V.:1600] Out: 305 [Urine:300; Blood:5]  BLOOD ADMINISTERED:none  DRAINS: none   LOCAL MEDICATIONS USED:  NONE  SPECIMEN:  No Specimen  DISPOSITION OF SPECIMEN:  N/A  COUNTS:  YES  TOURNIQUET:  * No tourniquets in log *  DICTATION: .Other Dictation: Dictation Number [id number]  PLAN OF CARE: Admit to inpatient   PATIENT DISPOSITION:  PACU - hemodynamically stable.   Delay start of Pharmacological VTE agent (>24hrs) due to surgical blood loss or risk of bleeding: not applicable
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
## 2017-06-23 NOTE — Anesthesia Postprocedure Evaluation (Signed)
Anesthesia Post Note  Patient: Joel Salas  Procedure(s) Performed: Procedure(s) (LRB): IRRIGATION AND DEBRIDEMENT LEFT LOWER LEG WOUND POSSIBLE CLOSURE (Left)     Patient location during evaluation: PACU Anesthesia Type: General Level of consciousness: awake and alert Pain management: pain level controlled Vital Signs Assessment: post-procedure vital signs reviewed and stable Respiratory status: spontaneous breathing, nonlabored ventilation, respiratory function stable and patient connected to nasal cannula oxygen Cardiovascular status: blood pressure returned to baseline and stable Postop Assessment: no signs of nausea or vomiting Anesthetic complications: no    Last Vitals:  Vitals:   06/23/17 2215 06/23/17 2233  BP: (!) 156/100 (!) 170/86  Pulse: 64 67  Resp: 17 17  Temp: 36.7 C 36.5 C  SpO2: 97% 94%    Last Pain:  Vitals:   06/23/17 2233  TempSrc: Oral  PainSc:                  Oleg Oleson DAVID

## 2017-06-23 NOTE — ED Notes (Signed)
Pt taken to CT.

## 2017-06-23 NOTE — Anesthesia Preprocedure Evaluation (Signed)
Anesthesia Evaluation  Patient identified by MRN, date of birth, ID band Patient awake    Reviewed: Allergy & Precautions, NPO status , Patient's Chart, lab work & pertinent test results  Airway Mallampati: I  TM Distance: >3 FB Neck ROM: Full    Dental   Pulmonary Current Smoker,    Pulmonary exam normal        Cardiovascular Normal cardiovascular exam     Neuro/Psych    GI/Hepatic   Endo/Other    Renal/GU      Musculoskeletal   Abdominal   Peds  Hematology   Anesthesia Other Findings   Reproductive/Obstetrics                             Anesthesia Physical Anesthesia Plan  ASA: II and emergent  Anesthesia Plan: General   Post-op Pain Management:    Induction: Intravenous, Rapid sequence and Cricoid pressure planned  PONV Risk Score and Plan: 1 and Ondansetron and Dexamethasone  Airway Management Planned: Oral ETT  Additional Equipment:   Intra-op Plan:   Post-operative Plan: Extubation in OR  Informed Consent: I have reviewed the patients History and Physical, chart, labs and discussed the procedure including the risks, benefits and alternatives for the proposed anesthesia with the patient or authorized representative who has indicated his/her understanding and acceptance.     Plan Discussed with: CRNA and Surgeon  Anesthesia Plan Comments:         Anesthesia Quick Evaluation

## 2017-06-24 ENCOUNTER — Encounter (HOSPITAL_COMMUNITY): Payer: Self-pay | Admitting: Orthopedic Surgery

## 2017-06-24 MED ORDER — ONDANSETRON HCL 4 MG/2ML IJ SOLN: 4.0000 mg | Freq: Four times a day (QID) | INTRAMUSCULAR | Status: DC | PRN

## 2017-06-24 MED ORDER — CEFAZOLIN SODIUM-DEXTROSE 1-4 GM/50ML-% IV SOLN
1.0000 g | Freq: Four times a day (QID) | INTRAVENOUS | Status: AC
  Administered 2017-06-24 (×3): 1 g via INTRAVENOUS
  Filled 2017-06-24 (×3): qty 50

## 2017-06-24 MED ORDER — METOCLOPRAMIDE HCL 5 MG/ML IJ SOLN
5.0000 mg | Freq: Three times a day (TID) | INTRAMUSCULAR | Status: DC | PRN
  Filled 2017-06-24: qty 2

## 2017-06-24 MED ORDER — SODIUM CHLORIDE 0.9 % IV SOLN
INTRAVENOUS | Status: DC
  Administered 2017-06-24: 04:00:00 via INTRAVENOUS

## 2017-06-24 MED ORDER — METOCLOPRAMIDE HCL 5 MG PO TABS
5.0000 mg | ORAL_TABLET | Freq: Three times a day (TID) | ORAL | Status: DC | PRN
Start: 2017-06-24 — End: 2017-06-25

## 2017-06-24 MED ORDER — ACETAMINOPHEN 650 MG RE SUPP: 650.0000 mg | Freq: Four times a day (QID) | RECTAL | Status: DC | PRN

## 2017-06-24 MED ORDER — ONDANSETRON HCL 4 MG PO TABS: 4.0000 mg | ORAL_TABLET | Freq: Four times a day (QID) | ORAL | Status: DC | PRN

## 2017-06-24 MED ORDER — HYDROCODONE-ACETAMINOPHEN 5-325 MG PO TABS
1.0000 | ORAL_TABLET | ORAL | Status: DC | PRN
  Administered 2017-06-24 – 2017-06-25 (×8): 2 via ORAL
  Filled 2017-06-24 (×9): qty 2

## 2017-06-24 MED ORDER — ALBUTEROL SULFATE (2.5 MG/3ML) 0.083% IN NEBU: 3.0000 mL | INHALATION_SOLUTION | RESPIRATORY_TRACT | Status: DC | PRN

## 2017-06-24 MED ORDER — ACETAMINOPHEN 325 MG PO TABS: 650.0000 mg | ORAL_TABLET | Freq: Four times a day (QID) | ORAL | Status: DC | PRN

## 2017-06-24 NOTE — Progress Notes (Signed)
Patient ID: Joel Salas, male   DOB: [birth year], 51 y.o.   MRN: [id number] Subjective: 1 Day Post-Op Procedure(s) (LRB): IRRIGATION AND DEBRIDEMENT LEFT LOWER LEG WOUND POSSIBLE CLOSURE (Left)    Patient reports pain as mild.  States relatively comfortable, feeling better this am.  Reviewed OR findings  Objective:   VITALS:   Vitals:   06/24/17 0010 06/24/17 0427  BP: (!) 161/87 (!) 144/84  Pulse: 69 75  Resp: 17 17  Temp: 98.9 F (37.2 C) 98.7 F (37.1 C)  SpO2: 100% 100%    Neurovascular intact Incision: dressing C/D/I  Some pain with active dorsiflexion (pulling of skin over leg?)  LABS  Recent Labs  06/23/17 1350 06/23/17 1359  HGB 16.2 16.7  HCT 47.5 49.0  WBC 9.3  --   PLT 237  --      Recent Labs  06/23/17 1350 06/23/17 1359  NA 139 140  K 3.6 3.6  BUN 13 16  CREATININE 1.55* 1.30*  GLUCOSE 146* 143*     Recent Labs  06/23/17 1350  INR 0.91     Assessment/Plan: 1 Day Post-Op Procedure(s) (LRB): IRRIGATION AND DEBRIDEMENT LEFT LOWER LEG WOUND POSSIBLE CLOSURE (Left)   Advance diet Up with therapy   I want continue IV antibiotics for ordered interval due to wound contamination Will plan to take dressing down tomorrow and look at status of wound then Anticipate if all looks good then discharge then PT to see and evaluate potential needs, WBAT May order cam walker boot to keep foot and ankle neutral to ease tension on leg with ambulation

## 2017-06-24 NOTE — Care Management (Signed)
No case manager needs identified.. 
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
## 2017-06-25 MED ORDER — HYDROCODONE-ACETAMINOPHEN 5-325 MG PO TABS: 1.0000 | ORAL_TABLET | Freq: Four times a day (QID) | ORAL | 0 refills | Status: DC | PRN

## 2017-06-25 NOTE — Progress Notes (Signed)
Orthopedic Tech Progress Note Patient Details:  Joel Salas 06-29-1966 [id number]  Patient ID: Joel Salas, male   DOB: [birth year], 51 y.o.   MRN: [id number]   [name] 06/25/2017, 11:58 AM Viewed order from doctor's order list

## 2017-06-25 NOTE — Progress Notes (Signed)
Discharge instructions reviewed with pt and family with verbal understanding. Pt left via wheelchair in NAD with all belongings at side.
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
## 2017-06-25 NOTE — Progress Notes (Signed)
Orthopedic Tech Progress Note Patient Details:  Joel Salas [birth year] [id number]  Ortho Devices Type of Ortho Device: Crutches Ortho Device/Splint Location: lle Ortho Device/Splint Interventions: Application   [name] 06/25/2017, 11:57 AM

## 2017-06-25 NOTE — Progress Notes (Signed)
Orthopedic Tech Progress Note Patient Details:  Joel Salas [birth year] [id number]  Ortho Devices Type of Ortho Device: CAM walker Ortho Device/Splint Location: lle Ortho Device/Splint Interventions: Application   Jailan Trimm 06/25/2017, 9:24 AM

## 2017-06-27 ENCOUNTER — Encounter: Payer: Self-pay | Admitting: Family Medicine
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
## 2017-07-08 DIAGNOSIS — Z4789 Encounter for other orthopedic aftercare: Secondary | ICD-10-CM | POA: Diagnosis not present

## 2017-07-08 DIAGNOSIS — S81812D Laceration without foreign body, left lower leg, subsequent encounter: Secondary | ICD-10-CM | POA: Diagnosis not present

## 2017-07-11 DIAGNOSIS — S81812D Laceration without foreign body, left lower leg, subsequent encounter: Secondary | ICD-10-CM | POA: Diagnosis not present

## 2017-07-11 DIAGNOSIS — Z4789 Encounter for other orthopedic aftercare: Secondary | ICD-10-CM | POA: Diagnosis not present

## 2017-10-11 ENCOUNTER — Other Ambulatory Visit: Payer: Self-pay | Admitting: Family Medicine

## 2017-10-11 DIAGNOSIS — J4520 Mild intermittent asthma, uncomplicated: Secondary | ICD-10-CM

## 2018-05-16 ENCOUNTER — Encounter: Payer: Self-pay | Admitting: Family Medicine

## 2018-05-16 ENCOUNTER — Ambulatory Visit (INDEPENDENT_AMBULATORY_CARE_PROVIDER_SITE_OTHER): Payer: BLUE CROSS/BLUE SHIELD | Admitting: Family Medicine

## 2018-05-16 ENCOUNTER — Other Ambulatory Visit: Payer: Self-pay

## 2018-05-16 VITALS — BP 170/100 | HR 71 | Temp 98.8°F | Ht 71.0 in | Wt 215.8 lb

## 2018-05-16 DIAGNOSIS — R7303 Prediabetes: Secondary | ICD-10-CM

## 2018-05-16 DIAGNOSIS — E785 Hyperlipidemia, unspecified: Secondary | ICD-10-CM | POA: Diagnosis not present

## 2018-05-16 DIAGNOSIS — Z0000 Encounter for general adult medical examination without abnormal findings: Secondary | ICD-10-CM | POA: Diagnosis not present

## 2018-05-16 DIAGNOSIS — R030 Elevated blood-pressure reading, without diagnosis of hypertension: Secondary | ICD-10-CM | POA: Diagnosis not present

## 2018-05-16 MED ORDER — AMLODIPINE BESYLATE 2.5 MG PO TABS: 2.5000 mg | ORAL_TABLET | Freq: Every day | ORAL | 1 refills | Status: DC

## 2018-05-16 NOTE — Patient Instructions (Addendum)
Continue exercise most days per week.   Keep a record of your blood pressures outside of the office and if running over 140/90 - start amlodipine once per day and follow up within a month. Cutting back on beer intake may also be helpful.   Can discuss shingles vaccine and prostate test next year.   Thanks for coming in today.   Keeping you healthy  Get these tests  Blood pressure- Have your blood pressure checked once a year by your healthcare provider.  Normal blood pressure is 120/80  Weight- Have your body mass index (BMI) calculated to screen for obesity.  BMI is a measure of body fat based on height and weight. You can also calculate your own BMI at [email or web address].  Cholesterol- Have your cholesterol checked every year.  Diabetes- Have your blood sugar checked regularly if you have high blood pressure, high cholesterol, have a family history of diabetes or if you are overweight.  Screening for Colon Cancer- Colonoscopy starting at age 76.  Screening may begin sooner depending on your family history and other health conditions. Follow up colonoscopy as directed by your Gastroenterologist.  Screening for Prostate Cancer- Both blood work (PSA) and a rectal exam help screen for Prostate Cancer.  Screening begins at age 53 with African-American men and at age 13 with Caucasian men.  Screening may begin sooner depending on your family history.  Take these medicines  Aspirin- One aspirin daily can help prevent Heart disease and Stroke.  Flu shot- Every fall.  Tetanus- Every 10 years.  Zostavax- Once after the age of 77 to prevent Shingles.  Pneumonia shot- Once after the age of 1; if you are younger than 53, ask your healthcare provider if you need a Pneumonia shot.  Take these steps  Don't smoke- If you do smoke, talk to your doctor about quitting.  For tips on how to quit, go to www.smokefree.gov or call 1-800-QUIT-NOW.  Be physically active- Exercise 5 days a week  for at least 30 minutes.  If you are not already physically active start slow and gradually work up to 30 minutes of moderate physical activity.  Examples of moderate activity include walking briskly, mowing the yard, dancing, swimming, bicycling, etc.  Eat a healthy diet- Eat a variety of healthy food such as fruits, vegetables, low fat milk, low fat cheese, yogurt, lean meant, poultry, fish, beans, tofu, etc. For more information go to www.thenutritionsource.org  Drink alcohol in moderation- Limit alcohol intake to less than two drinks a day. Never drink and drive.  Dentist- Brush and floss twice daily; visit your dentist twice a year.  Depression- Your emotional health is as important as your physical health. If you're feeling down, or losing interest in things you would normally enjoy please talk to your healthcare provider.  Eye exam- Visit your eye doctor every year.  Safe sex- If you may be exposed to a sexually transmitted infection, use a condom.  Seat belts- Seat belts can save your life; always wear one.  Smoke/Carbon Monoxide detectors- These detectors need to be installed on the appropriate level of your home.  Replace batteries at least once a year.  Skin cancer- When out in the sun, cover up and use sunscreen 15 SPF or higher.  Violence- If anyone is threatening you, please tell your healthcare provider.  Living Will/ Health care power of attorney- Speak with your healthcare provider and family.  How to Take Your Blood Pressure You can take your blood pressure at  home with a machine. You may need to check your blood pressure at home:  To check if you have high blood pressure (hypertension).  To check your blood pressure over time.  To make sure your blood pressure medicine is working.  Supplies needed: You will need a blood pressure machine, or monitor. You can buy one at a drugstore or online. When choosing one:  Choose one with an arm cuff.  Choose one that  wraps around your upper arm. Only one finger should fit between your arm and the cuff.  Do not choose one that measures your blood pressure from your wrist or finger.  Your doctor can suggest a monitor. How to prepare Avoid these things for 30 minutes before checking your blood pressure:  Drinking caffeine.  Drinking alcohol.  Eating.  Smoking.  Exercising.  Five minutes before checking your blood pressure:  Pee.  Sit in a dining chair. Avoid sitting in a soft couch or armchair.  Be quiet. Do not talk.  How to take your blood pressure Follow the instructions that came with your machine. If you have a digital blood pressure monitor, these may be the instructions: 1. Sit up straight. 2. Place your feet on the floor. Do not cross your ankles or legs. 3. Rest your left arm at the level of your heart. You may rest it on a table, desk, or chair. 4. Pull up your shirt sleeve. 5. Wrap the blood pressure cuff around the upper part of your left arm. The cuff should be 1 inch (2.5 cm) above your elbow. It is best to wrap the cuff around bare skin. 6. Fit the cuff snugly around your arm. You should be able to place only one finger between the cuff and your arm. 7. Put the cord inside the groove of your elbow. 8. Press the power button. 9. Sit quietly while the cuff fills with air and loses air. 10. Write down the numbers on the screen. 11. Wait 2-3 minutes and then repeat steps 1-10.  What do the numbers mean? Two numbers make up your blood pressure. The first number is called systolic pressure. The second is called diastolic pressure. An example of a blood pressure reading is "120 over 80" (or 120/80). If you are an adult and do not have a medical condition, use this guide to find out if your blood pressure is normal: Normal  First number: below 120.  Second number: below 80. Elevated  First number: 120-129.  Second number: below 80. Hypertension stage 1  First number:  130-139.  Second number: 80-89. Hypertension stage 2  First number: 140 or above.  Second number: 88 or above. Your blood pressure is above normal even if only the top or bottom number is above normal. Follow these instructions at home:  Check your blood pressure as often as your doctor tells you to.  Take your monitor to your next doctor's appointment. Your doctor will: ? Make sure you are using it correctly. ? Make sure it is working right.  Make sure you understand what your blood pressure numbers should be.  Tell your doctor if your medicines are causing side effects. Contact a doctor if:  Your blood pressure keeps being high. Get help right away if:  Your first blood pressure number is higher than 180.  Your second blood pressure number is higher than 120. This information is not intended to replace advice given to you by your health care provider. Make sure you discuss any  questions you have with your health care provider. Document Released: 10/03/2008 Document Revised: 09/18/2016 Document Reviewed: 03/29/2016 Elsevier Interactive Patient Education  2018 [employer].   IF you received an x-ray today, you will receive an invoice from [facility]. Please contact [facility] at [phone] with questions or concerns regarding your invoice.   IF you received labwork today, you will receive an invoice from [facility]. Please contact [facility] at [phone] with questions or concerns regarding your invoice.   Our billing staff will not be able to assist you with questions regarding bills from these companies.  You will be contacted with the lab results as soon as they are available. The fastest way to get your results is to activate your My Chart account. Instructions are located on the last page of this paperwork. If you have not heard from [place] regarding the results in 2 weeks, please contact this office.

## 2018-05-16 NOTE — Progress Notes (Signed)
Subjective:  By signing my name below, I, Moises Blood, attest that this documentation has been prepared under the direction and in the presence of Merri Ray, MD. Electronically Signed: Moises Blood, [place]. 05/16/2018 , 11:34 AM .  Patient was seen in Room 2 .   Patient ID: Joel Salas, male    DOB: 09/20/1966, 52 y.o.   MRN: [id number] Chief Complaint  Patient presents with  . Annual Exam    CPE   HPI Joel Salas is a 52 y.o. male Here for annual physical. He has a history of elevated BP, hyperlipidemia and asthma. He was last seen for a physical in June 2018. Of note, his BP was normal at that time.   He's getting married on Oct 18th; will be together for 5 years on Oct 17th.   Elevated BP His home BP readings have been running around 140s/70s. He was previously on BP medication but doesn't recall what it was. He drinks alcohol, about 10-12 pints a week. He denies chest pain, shortness of breath, lightheadedness, dizziness, headache, or dark tarry stool.   Hyperlipidemia Lab Results  Component Value Date   CHOL 217 (H) 04/15/2017   HDL 52 04/15/2017   LDLCALC 144 (H) 04/15/2017   TRIG 103 04/15/2017   CHOLHDL 4.2 04/15/2017   Lab Results  Component Value Date   ALT 44 06/23/2017   AST 36 06/23/2017   ALKPHOS 42 06/23/2017   BILITOT 1.4 (H) 06/23/2017   Reported muscle cramping and possible syncope, but believe statin versus BP medication versus Paxil. His 10-year CVD risk was low enough to not start statin.   Anxiety He previously used Paxil for anxiety, but he's been biking for exercise and has been more therapeutic for him than Paxil did. He's still riding about 5 days a week. He rides road and mountain.   Syncope Previous episode of syncope thought due to dehydration. He had cardiology eval. Denies recent sx's.   Asthma Asthma was mildly intermittent based on last discussion; continued on albuterol as needed.   Pre-diabetes Lab Results    Component Value Date   HGBA1C 5.7 (H) 04/15/2017   His A1C had improved from 6.0; recommended to continue exercise and diet.   Cancer Screening Colonoscopy: 2017, repeat 2-3 years; believes due next year.  Prostate cancer screening: will defer this to every other year.  Lab Results  Component Value Date   PSA1 1.1 04/15/2017    Immunizations Immunization History  Administered Date(s) Administered  . Influenza,inj,Quad PF,6+ Mos 09/29/2013, 08/03/2014, 09/06/2015  . Tdap 02/03/2008, 06/23/2017   Shingles vaccine: last shingles episode about 2-3 years ago. He'll defer it for next year.   Depression Depression screen [facility] 2/9 05/16/2018 04/15/2017 04/19/2016 11/28/2015 10/25/2015  Decreased Interest 0 0 0 0 0  Down, Depressed, Hopeless 0 0 0 0 0  PHQ - 2 Score 0 0 0 0 0     Vision  Visual Acuity Screening   Right eye Left eye Both eyes  Without correction:     With correction: 20/25 20/25 20/20     He has an appointment with eye doctor next month; last seen in Oct 2018.   Dentist He's followed by dentist, seen about every 6 months.   Exercise He bikes about ~60 miles/week, both road and mountain.    Patient Active Problem List   Diagnosis Date Noted  . Leg laceration, left, initial encounter 06/23/2017  . Syncope 12/11/2015  . H/O cervical spine surgery-2011 09/29/2013  .  Hyperlipidemia 07/01/2012  . Elevated BP 07/01/2012  . BMI 30.0-30.9,adult 07/01/2012   Past Medical History:  Diagnosis Date  . Allergy   . Anxiety   . Arthritis   . Asthma    allery induced asthma  . Asthma    allergy induced asthma  . Hyperlipidemia    meds on hold- borderline  . Hyperlipidemia   . Syncopal episodes    Past Surgical History:  Procedure Laterality Date  . CERVICAL SPINE SURGERY    . COLONOSCOPY    . I&D EXTREMITY Left 06/23/2017   Procedure: IRRIGATION AND DEBRIDEMENT LEFT LOWER LEG WOUND POSSIBLE CLOSURE;  Surgeon: Paralee Cancel, MD;  Location: [facility];  Service:  Orthopedics;  Laterality: Left;  . SPINE SURGERY    . TONSILLECTOMY    . WISDOM TOOTH EXTRACTION     No Known Allergies Prior to Admission medications   Medication Sig Start Date End Date Taking? Authorizing Provider  albuterol (PROVENTIL HFA;VENTOLIN HFA) 108 (90 Base) MCG/ACT inhaler Inhale 2 puffs into the lungs every 4 (four) hours as needed for wheezing or shortness of breath.    [provider]  albuterol (VENTOLIN HFA) 108 (90 Base) MCG/ACT inhaler INHALE 2 PUFFS INTO THE LUNGS EVERY 4 HOURS AS NEEDED 10/11/17   Wendie Agreste, MD  cetirizine (ZYRTEC) 10 MG chewable tablet Chew 10 mg by mouth daily. Reported on 12/22/2015    [provider]  HYDROcodone-acetaminophen (NORCO/VICODIN) 5-325 MG tablet Take 1-2 tablets by mouth every 6 (six) hours as needed (breakthrough pain). 06/25/17   Paralee Cancel, MD  ibuprofen (ADVIL,MOTRIN) 200 MG tablet Take 200 mg by mouth every 8 (eight) hours as needed.    [provider]  ibuprofen (ADVIL,MOTRIN) 200 MG tablet Take 200-800 mg by mouth every 6 (six) hours as needed for headache or mild pain.    [provider]  OVER THE COUNTER MEDICATION Place 1 drop under the tongue daily. CBD oil    [provider]   Social History   Socioeconomic History  . Marital status: Single    Spouse name: Not on file  . Number of children: Not on file  . Years of education: Not on file  . Highest education level: Not on file  Occupational History  . Occupation: Civil engineer, contracting: [employer]  Social Needs  . Financial resource strain: Not on file  . Food insecurity:    Worry: Not on file    Inability: Not on file  . Transportation needs:    Medical: Not on file    Non-medical: Not on file  Tobacco Use  . Smoking status: Current Some Day Smoker  . Smokeless tobacco: Never Used  . Tobacco comment: social  Substance and Sexual Activity  . Alcohol use: Yes    Alcohol/week: 8.4 oz     Types: 14 Cans of beer per week    Comment: 12-15 per week   . Drug use: Yes    Frequency: 7.0 times per week    Types: Marijuana  . Sexual activity: Yes  Lifestyle  . Physical activity:    Days per week: Not on file    Minutes per session: Not on file  . Stress: Not on file  Relationships  . Social connections:    Talks on phone: Not on file    Gets together: Not on file    Attends religious service: Not on file    Active member of club or organization: Not on  file    Attends meetings of clubs or organizations: Not on file    Relationship status: Not on file  . Intimate partner violence:    Fear of current or ex partner: Not on file    Emotionally abused: Not on file    Physically abused: Not on file    Forced sexual activity: Not on file  Other Topics Concern  . Not on file  Social History Narrative   ** Merged History Encounter **       Single. Education: college.  Patient drinks about 8 cups of caffeine daily. Patient is ambidextrous.    Review of Systems 13 point ROS - negative     Objective:   Physical Exam  Constitutional: He is oriented to person, place, and time. He appears well-developed and well-nourished.  HENT:  Head: Normocephalic and atraumatic.  Right Ear: External ear normal.  Left Ear: External ear normal.  Mouth/Throat: Oropharynx is clear and moist.  Eyes: Pupils are equal, round, and reactive to light. Conjunctivae and EOM are normal.  Neck: Normal range of motion. Neck supple. No thyromegaly present.  Cardiovascular: Normal rate, regular rhythm, normal heart sounds and intact distal pulses.  Pulmonary/Chest: Effort normal and breath sounds normal. No respiratory distress. He has no wheezes.  Abdominal: Soft. He exhibits no distension. There is no tenderness.  Musculoskeletal: Normal range of motion. He exhibits no edema or tenderness.  Lymphadenopathy:    He has no cervical adenopathy.  Neurological: He is alert and oriented to person,  place, and time. He has normal reflexes.  Skin: Skin is warm and dry.  Psychiatric: He has a normal mood and affect. His behavior is normal.  Vitals reviewed.    Vitals:   05/16/18 1108 05/16/18 1112  BP: (!) 164/88 (!) 170/100  Pulse: 71   Temp: 98.8 F (37.1 C)   TempSrc: Oral   SpO2: 96%   Weight: 215 lb 12.8 oz (97.9 kg)   Height: 5\' 11"  (1.803 m)        Assessment & Plan:    GUST EUGENE is a 52 y.o. male Annual physical exam  - -anticipatory guidance as below in AVS, screening labs above. Health maintenance items as above in HPI discussed/recommended as applicable.  Declined prostate cancer screening at this time  Elevated blood pressure reading - Plan: amLODipine (NORVASC) 2.5 MG tablet, Comprehensive metabolic panel  -Suspected some component of hypertension with whitecoat hypertension.  Did recommend to cut back on beer/alcohol intake as it may also be affecting blood pressure.  Monitor outside blood pressure, but recommended starting amlodipine 2.5 mg daily.  Reported hypotensive symptoms in the past with meds, but this was reason for starting low dose.  RTC precautions given, including if he starts medication.  Labs pending.  Prediabetes - Plan: Hemoglobin A1c, Comprehensive metabolic panel  -Continue exercise, watch diet, check A1c  Hyperlipidemia, unspecified hyperlipidemia type - Plan: Lipid panel, Comprehensive metabolic panel  -Check lipids to determine ASCVD risk and statin recommendation.  Meds ordered this encounter  Medications  . amLODipine (NORVASC) 2.5 MG tablet    Sig: Take 1 tablet (2.5 mg total) by mouth daily.    Dispense:  30 tablet    Refill:  1   Patient Instructions   Continue exercise most days per week.   Keep a record of your blood pressures outside of the office and if running over 140/90 - start amlodipine once per day and follow up within a month. Cutting back on  beer intake may also be helpful.   Can discuss shingles vaccine  and prostate test next year.   Thanks for coming in today.   Keeping you healthy  Get these tests  Blood pressure- Have your blood pressure checked once a year by your healthcare provider.  Normal blood pressure is 120/80  Weight- Have your body mass index (BMI) calculated to screen for obesity.  BMI is a measure of body fat based on height and weight. You can also calculate your own BMI at ViewBanking.si.  Cholesterol- Have your cholesterol checked every year.  Diabetes- Have your blood sugar checked regularly if you have high blood pressure, high cholesterol, have a family history of diabetes or if you are overweight.  Screening for Colon Cancer- Colonoscopy starting at age 78.  Screening may begin sooner depending on your family history and other health conditions. Follow up colonoscopy as directed by your Gastroenterologist.  Screening for Prostate Cancer- Both blood work (PSA) and a rectal exam help screen for Prostate Cancer.  Screening begins at age 19 with African-American men and at age 65 with Caucasian men.  Screening may begin sooner depending on your family history.  Take these medicines  Aspirin- One aspirin daily can help prevent Heart disease and Stroke.  Flu shot- Every fall.  Tetanus- Every 10 years.  Zostavax- Once after the age of 55 to prevent Shingles.  Pneumonia shot- Once after the age of 30; if you are younger than 34, ask your healthcare provider if you need a Pneumonia shot.  Take these steps  Don't smoke- If you do smoke, talk to your doctor about quitting.  For tips on how to quit, go to www.smokefree.gov or call 1-800-QUIT-NOW.  Be physically active- Exercise 5 days a week for at least 30 minutes.  If you are not already physically active start slow and gradually work up to 30 minutes of moderate physical activity.  Examples of moderate activity include walking briskly, mowing the yard, dancing, swimming, bicycling, etc.  Eat a healthy  diet- Eat a variety of healthy food such as fruits, vegetables, low fat milk, low fat cheese, yogurt, lean meant, poultry, fish, beans, tofu, etc. For more information go to www.thenutritionsource.org  Drink alcohol in moderation- Limit alcohol intake to less than two drinks a day. Never drink and drive.  Dentist- Brush and floss twice daily; visit your dentist twice a year.  Depression- Your emotional health is as important as your physical health. If you're feeling down, or losing interest in things you would normally enjoy please talk to your healthcare provider.  Eye exam- Visit your eye doctor every year.  Safe sex- If you may be exposed to a sexually transmitted infection, use a condom.  Seat belts- Seat belts can save your life; always wear one.  Smoke/Carbon Monoxide detectors- These detectors need to be installed on the appropriate level of your home.  Replace batteries at least once a year.  Skin cancer- When out in the sun, cover up and use sunscreen 15 SPF or higher.  Violence- If anyone is threatening you, please tell your healthcare provider.  Living Will/ Health care power of attorney- Speak with your healthcare provider and family.  How to Take Your Blood Pressure You can take your blood pressure at home with a machine. You may need to check your blood pressure at home:  To check if you have high blood pressure (hypertension).  To check your blood pressure over time.  To make sure your blood  pressure medicine is working.  Supplies needed: You will need a blood pressure machine, or monitor. You can buy one at a drugstore or online. When choosing one:  Choose one with an arm cuff.  Choose one that wraps around your upper arm. Only one finger should fit between your arm and the cuff.  Do not choose one that measures your blood pressure from your wrist or finger.  Your doctor can suggest a monitor. How to prepare Avoid these things for 30 minutes before checking  your blood pressure:  Drinking caffeine.  Drinking alcohol.  Eating.  Smoking.  Exercising.  Five minutes before checking your blood pressure:  Pee.  Sit in a dining chair. Avoid sitting in a soft couch or armchair.  Be quiet. Do not talk.  How to take your blood pressure Follow the instructions that came with your machine. If you have a digital blood pressure monitor, these may be the instructions: 1. Sit up straight. 2. Place your feet on the floor. Do not cross your ankles or legs. 3. Rest your left arm at the level of your heart. You may rest it on a table, desk, or chair. 4. Pull up your shirt sleeve. 5. Wrap the blood pressure cuff around the upper part of your left arm. The cuff should be 1 inch (2.5 cm) above your elbow. It is best to wrap the cuff around bare skin. 6. Fit the cuff snugly around your arm. You should be able to place only one finger between the cuff and your arm. 7. Put the cord inside the groove of your elbow. 8. Press the power button. 9. Sit quietly while the cuff fills with air and loses air. 10. Write down the numbers on the screen. 11. Wait 2-3 minutes and then repeat steps 1-10.  What do the numbers mean? Two numbers make up your blood pressure. The first number is called systolic pressure. The second is called diastolic pressure. An example of a blood pressure reading is "120 over 80" (or 120/80). If you are an adult and do not have a medical condition, use this guide to find out if your blood pressure is normal: Normal  First number: below 120.  Second number: below 80. Elevated  First number: 120-129.  Second number: below 80. Hypertension stage 1  First number: 130-139.  Second number: 80-89. Hypertension stage 2  First number: 140 or above.  Second number: 27 or above. Your blood pressure is above normal even if only the top or bottom number is above normal. Follow these instructions at home:  Check your blood pressure as  often as your doctor tells you to.  Take your monitor to your next doctor's appointment. Your doctor will: ? Make sure you are using it correctly. ? Make sure it is working right.  Make sure you understand what your blood pressure numbers should be.  Tell your doctor if your medicines are causing side effects. Contact a doctor if:  Your blood pressure keeps being high. Get help right away if:  Your first blood pressure number is higher than 180.  Your second blood pressure number is higher than 120. This information is not intended to replace advice given to you by your health care provider. Make sure you discuss any questions you have with your health care provider. Document Released: 10/03/2008 Document Revised: 09/18/2016 Document Reviewed: 03/29/2016 Elsevier Interactive Patient Education  2018 [employer].   IF you received an x-ray today, you will receive an invoice  from [facility]. Please contact [facility] at [phone] with questions or concerns regarding your invoice.   IF you received labwork today, you will receive an invoice from [facility]. Please contact [facility] at [phone] with questions or concerns regarding your invoice.   Our billing staff will not be able to assist you with questions regarding bills from these companies.  You will be contacted with the lab results as soon as they are available. The fastest way to get your results is to activate your My Chart account. Instructions are located on the last page of this paperwork. If you have not heard from [place] regarding the results in 2 weeks, please contact this office.       I personally performed the services described in this documentation, which was scribed in my presence. The recorded information has been reviewed and considered for accuracy and completeness, addended by me as needed, and agree with information above.  Signed,   Merri Ray, MD [facility].  05/17/18 9:56 PM

## 2018-05-17 LAB — HEMOGLOBIN A1C
Est. average glucose Bld gHb Est-mCnc: 120 mg/dL
Hgb A1c MFr Bld: 5.8 % — ABNORMAL HIGH (ref 4.8–5.6)

## 2018-05-17 LAB — COMPREHENSIVE METABOLIC PANEL
ALT: 28 IU/L (ref 0–44)
AST: 23 IU/L (ref 0–40)
Albumin/Globulin Ratio: 2.2 (ref 1.2–2.2)
Albumin: 4.8 g/dL (ref 3.5–5.5)
Alkaline Phosphatase: 51 IU/L (ref 39–117)
BUN/Creatinine Ratio: 19 (ref 9–20)
BUN: 25 mg/dL — ABNORMAL HIGH (ref 6–24)
Bilirubin Total: 0.6 mg/dL (ref 0.0–1.2)
CO2: 18 mmol/L — ABNORMAL LOW (ref 20–29)
Calcium: 10.2 mg/dL (ref 8.7–10.2)
Chloride: 103 mmol/L (ref 96–106)
Creatinine, Ser: 1.35 mg/dL — ABNORMAL HIGH (ref 0.76–1.27)
GFR calc Af Amer: 69 mL/min/{1.73_m2} (ref >59)
GFR calc non Af Amer: 60 mL/min/{1.73_m2} (ref >59)
Globulin, Total: 2.2 g/dL (ref 1.5–4.5)
Glucose: 96 mg/dL (ref 65–99)
Potassium: 4.4 mmol/L (ref 3.5–5.2)
Sodium: 140 mmol/L (ref 134–144)
Total Protein: 7 g/dL (ref 6.0–8.5)

## 2018-05-17 LAB — LIPID PANEL
Chol/HDL Ratio: 4.6 ratio (ref 0.0–5.0)
Cholesterol, Total: 209 mg/dL — ABNORMAL HIGH (ref 100–199)
HDL: 45 mg/dL (ref >39)
LDL Calculated: 145 mg/dL — ABNORMAL HIGH (ref 0–99)
Triglycerides: 94 mg/dL (ref 0–149)
VLDL Cholesterol Cal: 19 mg/dL (ref 5–40)

## 2018-05-27 ENCOUNTER — Other Ambulatory Visit: Payer: Self-pay | Admitting: Family Medicine

## 2018-05-27 ENCOUNTER — Encounter: Payer: Self-pay | Admitting: Family Medicine

## 2018-05-27 DIAGNOSIS — R7989 Other specified abnormal findings of blood chemistry: Secondary | ICD-10-CM

## 2018-05-27 NOTE — Progress Notes (Signed)
Bmp future order for recheck creat and CO2.

## 2018-06-01 ENCOUNTER — Encounter: Payer: Self-pay | Admitting: Family Medicine

## 2018-06-05 MED ORDER — ATORVASTATIN CALCIUM 10 MG PO TABS: 10.0000 mg | ORAL_TABLET | Freq: Every day | ORAL | 0 refills | Status: DC

## 2018-06-16 ENCOUNTER — Ambulatory Visit: Payer: BLUE CROSS/BLUE SHIELD | Admitting: Family Medicine

## 2018-09-28 ENCOUNTER — Other Ambulatory Visit: Payer: Self-pay | Admitting: Family Medicine

## 2018-09-28 DIAGNOSIS — J4520 Mild intermittent asthma, uncomplicated: Secondary | ICD-10-CM

## 2018-09-28 MED ORDER — ALBUTEROL SULFATE HFA 108 (90 BASE) MCG/ACT IN AERS: INHALATION_SPRAY | RESPIRATORY_TRACT | 0 refills | Status: DC

## 2018-11-05 ENCOUNTER — Other Ambulatory Visit: Payer: Self-pay | Admitting: Family Medicine

## 2018-11-05 NOTE — Telephone Encounter (Signed)
Requested Prescriptions  Pending Prescriptions Disp Refills  . atorvastatin (LIPITOR) 10 MG tablet [Pharmacy Med Name: ATORVASTATIN 10MG  TABLETS] 90 tablet 2    Sig: TAKE 1 TABLET(10 MG) BY MOUTH DAILY     Cardiovascular:  Antilipid - Statins Failed - 11/05/2018  7:10 AM      Failed - Total Cholesterol in normal range and within 360 days    Cholesterol, Total  Date Value Ref Range Status  05/16/2018 209 (H) 100 - 199 mg/dL Final         Failed - LDL in normal range and within 360 days    LDL Calculated  Date Value Ref Range Status  05/16/2018 145 (H) 0 - 99 mg/dL Final         Passed - HDL in normal range and within 360 days    HDL  Date Value Ref Range Status  05/16/2018 45 >39 mg/dL Final         Passed - Triglycerides in normal range and within 360 days    Triglycerides  Date Value Ref Range Status  05/16/2018 94 0 - 149 mg/dL Final         Passed - Patient is not pregnant      Passed - Valid encounter within last 12 months    Recent Outpatient Visits          5 months ago Annual physical exam   [facility], Ranell Patrick, MD   1 year ago Annual physical exam   [facility], Ranell Patrick, MD   2 years ago Shingles   [facility], Ranell Patrick, MD   2 years ago Syncope and collapse   [facility], Laurey Arrow, MD   3 years ago Annual physical exam   [facility], Linton Ham, MD

## 2018-12-10 ENCOUNTER — Other Ambulatory Visit: Payer: Self-pay | Admitting: Family Medicine

## 2018-12-10 DIAGNOSIS — R030 Elevated blood-pressure reading, without diagnosis of hypertension: Secondary | ICD-10-CM

## 2018-12-10 NOTE — Telephone Encounter (Signed)
30 day courtesy refill provided with NO refills. Next OV 12/25/18.

## 2018-12-24 ENCOUNTER — Telehealth: Payer: Self-pay | Admitting: Family Medicine

## 2018-12-24 NOTE — Telephone Encounter (Signed)
LVM for pt to call the office and reschedule due to the weather. When pt calls back, please reschedule at his convenience. Thank you

## 2018-12-25 ENCOUNTER — Ambulatory Visit: Payer: 59 | Admitting: Family Medicine

## 2018-12-25 ENCOUNTER — Encounter: Payer: Self-pay | Admitting: Family Medicine

## 2018-12-25 ENCOUNTER — Ambulatory Visit: Payer: BLUE CROSS/BLUE SHIELD | Admitting: Family Medicine

## 2018-12-25 ENCOUNTER — Other Ambulatory Visit: Payer: Self-pay

## 2018-12-25 VITALS — BP 152/98 | HR 86 | Temp 98.6°F | Resp 17 | Ht 71.0 in | Wt 231.0 lb

## 2018-12-25 DIAGNOSIS — R7303 Prediabetes: Secondary | ICD-10-CM

## 2018-12-25 DIAGNOSIS — I10 Essential (primary) hypertension: Secondary | ICD-10-CM

## 2018-12-25 DIAGNOSIS — Z23 Encounter for immunization: Secondary | ICD-10-CM | POA: Diagnosis not present

## 2018-12-25 DIAGNOSIS — Z1283 Encounter for screening for malignant neoplasm of skin: Secondary | ICD-10-CM

## 2018-12-25 DIAGNOSIS — E785 Hyperlipidemia, unspecified: Secondary | ICD-10-CM

## 2018-12-25 DIAGNOSIS — Z1211 Encounter for screening for malignant neoplasm of colon: Secondary | ICD-10-CM | POA: Diagnosis not present

## 2018-12-25 DIAGNOSIS — Z1159 Encounter for screening for other viral diseases: Secondary | ICD-10-CM | POA: Diagnosis not present

## 2018-12-25 MED ORDER — AMLODIPINE BESYLATE 5 MG PO TABS: 5.0000 mg | ORAL_TABLET | Freq: Every day | ORAL | 1 refills | Status: DC

## 2018-12-25 MED ORDER — ATORVASTATIN CALCIUM 10 MG PO TABS: ORAL_TABLET | ORAL | 1 refills | Status: DC

## 2018-12-25 NOTE — Patient Instructions (Addendum)
I will check some screening tests, and refer you to dermatology.  Increase amlodipine to 5 mg.  recehck 3 months. Sooner if blood pressure remains high.  See info on prediabetes and diet.  Try to keep exercise in low to moderate intensity (no more than 60-80% peak heart rate - about 130-140). I am happy to refer you to cardiology for stress testing if increased exercise considered.      Prediabetes Eating Plan Prediabetes is a condition that causes blood sugar (glucose) levels to be higher than normal. This increases the risk for developing diabetes. In order to prevent diabetes from developing, your health care provider may recommend a diet and other lifestyle changes to help you:  Control your blood glucose levels.  Improve your cholesterol levels.  Manage your blood pressure. Your health care provider may recommend working with a diet and nutrition specialist (dietitian) to make a meal plan that is best for you. What are tips for following this plan? Lifestyle  Set weight loss goals with the help of your health care team. It is recommended that most people with prediabetes lose 7% of their current body weight.  Exercise for at least 30 minutes at least 5 days a week.  Attend a support group or seek ongoing support from a mental health counselor.  Take over-the-counter and prescription medicines only as told by your health care provider. Reading food labels  Read food labels to check the amount of fat, salt (sodium), and sugar in prepackaged foods. Avoid foods that have: ? Saturated fats. ? Trans fats. ? Added sugars.  Avoid foods that have more than 300 milligrams (mg) of sodium per serving. Limit your daily sodium intake to less than 2,300 mg each day. Shopping  Avoid buying pre-made and processed foods. Cooking  Cook with olive oil. Do not use butter, lard, or ghee.  Bake, broil, grill, or boil foods. Avoid frying. Meal planning   Work with your dietitian to  develop an eating plan that is right for you. This may include: ? Tracking how many calories you take in. Use a food diary, notebook, or mobile application to track what you eat at each meal. ? Using the glycemic index (GI) to plan your meals. The index tells you how quickly a food will raise your blood glucose. Choose low-GI foods. These foods take a longer time to raise blood glucose.  Consider following a Mediterranean diet. This diet includes: ? Several servings each day of fresh fruits and vegetables. ? Eating fish at least twice a week. ? Several servings each day of whole grains, beans, nuts, and seeds. ? Using olive oil instead of other fats. ? Moderate alcohol consumption. ? Eating small amounts of red meat and whole-fat dairy.  If you have high blood pressure, you may need to limit your sodium intake or follow a diet such as the DASH eating plan. DASH is an eating plan that aims to lower high blood pressure. What foods are recommended? The items listed below may not be a complete list. Talk with your dietitian about what dietary choices are best for you. Grains Whole grains, such as whole-wheat or whole-grain breads, crackers, cereals, and pasta. Unsweetened oatmeal. Bulgur. Barley. Quinoa. Brown rice. Corn or whole-wheat flour tortillas or taco shells. Vegetables Lettuce. Spinach. Peas. Beets. Cauliflower. Cabbage. Broccoli. Carrots. Tomatoes. Squash. Eggplant. Herbs. Peppers. Onions. Cucumbers. Brussels sprouts. Fruits Berries. Bananas. Apples. Oranges. Grapes. Papaya. Mango. Pomegranate. Kiwi. Grapefruit. Cherries. Meats and other protein foods Seafood. Poultry  without skin. Lean cuts of pork and beef. Tofu. Eggs. Nuts. Beans. Dairy Low-fat or fat-free dairy products, such as yogurt, cottage cheese, and cheese. Beverages Water. Tea. Coffee. Sugar-free or diet soda. Seltzer water. Lowfat or no-fat milk. Milk alternatives, such as soy or almond milk. Fats and oils Olive oil.  Canola oil. Sunflower oil. Grapeseed oil. Avocado. Walnuts. Sweets and desserts Sugar-free or low-fat pudding. Sugar-free or low-fat ice cream and other frozen treats. Seasoning and other foods Herbs. Sodium-free spices. Mustard. Relish. Low-fat, low-sugar ketchup. Low-fat, low-sugar barbecue sauce. Low-fat or fat-free mayonnaise. What foods are not recommended? The items listed below may not be a complete list. Talk with your dietitian about what dietary choices are best for you. Grains Refined white flour and flour products, such as bread, pasta, snack foods, and cereals. Vegetables Canned vegetables. Frozen vegetables with butter or cream sauce. Fruits Fruits canned with syrup. Meats and other protein foods Fatty cuts of meat. Poultry with skin. Breaded or fried meat. Processed meats. Dairy Full-fat yogurt, cheese, or milk. Beverages Sweetened drinks, such as sweet iced tea and soda. Fats and oils Butter. Lard. Ghee. Sweets and desserts Baked goods, such as cake, cupcakes, pastries, cookies, and cheesecake. Seasoning and other foods Spice mixes with added salt. Ketchup. Barbecue sauce. Mayonnaise. Summary  To prevent diabetes from developing, you may need to make diet and other lifestyle changes to help control blood sugar, improve cholesterol levels, and manage your blood pressure.  Set weight loss goals with the help of your health care team. It is recommended that most people with prediabetes lose 7 percent of their current body weight.  Consider following a Mediterranean diet that includes plenty of fresh fruits and vegetables, whole grains, beans, nuts, seeds, fish, lean meat, low-fat dairy, and healthy oils. This information is not intended to replace advice given to you by your health care provider. Make sure you discuss any questions you have with your health care provider. Document Released: 03/07/2015 Document Revised: 12/25/2016 Document Reviewed: 12/25/2016 Elsevier  Interactive Patient Education  [employer].    If you have lab work done today you will be contacted with your lab results within the next 2 weeks.  If you have not heard from [place] then please contact us. The fastest way to get your results is to register for My Chart.   IF you received an x-ray today, you will receive an invoice from [facility]. Please contact [facility] at [phone] with questions or concerns regarding your invoice.   IF you received labwork today, you will receive an invoice from [place]. Please contact [facility] at [phone] with questions or concerns regarding your invoice.   Our billing staff will not be able to assist you with questions regarding bills from these companies.  You will be contacted with the lab results as soon as they are available. The fastest way to get your results is to activate your My Chart account. Instructions are located on the last page of this paperwork. If you have not heard from [place] regarding the results in 2 weeks, please contact this office.

## 2018-12-25 NOTE — Progress Notes (Signed)
Subjective:    Patient ID: Joel Salas, male    DOB: 08/08/1966, 53 y.o.   MRN: [id number]  HPI Joel Salas is a 53 y.o. male Presents today for: Chief Complaint  Patient presents with  . bloodwork recheck  . Hypertension  . Referral    for Derm to see if he has skin cancer. reports no sx   Hypertension: BP Readings from Last 3 Encounters:  12/25/18 (!) 152/98  05/16/18 (!) 170/100  06/25/17 117/79   Lab Results  Component Value Date   CREATININE 1.35 (H) 05/16/2018  Currently on Norvasc 2.5 mg daily. Last visit July 2019. Advised 1 month follow up. Had some insurance issues.  Home readings: 140/90's.   elev creatinine - recommend repeat check - lab only visit - did not have done.  Urinating ok.  advil on occasion - had been using more prior to last visit when creatinine checked above.  Alcohol - 7-8 drinks per week.  No recent stress test. Exercises to HR 180's at times  Prediabetes: Riding bike 5-6 hrs per week. Rare fast food - less than 1 per week. More desserts lately.  Up 16# since last visit.  Lab Results  Component Value Date   HGBA1C 5.8 (H) 05/16/2018   Wt Readings from Last 3 Encounters:  12/25/18 231 lb (104.8 kg)  05/16/18 215 lb 12.8 oz (97.9 kg)  06/23/17 212 lb (96.2 kg)   Hyperlipidemia: Lab Results  Component Value Date   CHOL 209 (H) 05/16/2018   HDL 45 05/16/2018   LDLCALC 145 (H) 05/16/2018   TRIG 94 05/16/2018   CHOLHDL 4.6 05/16/2018   Lab Results  Component Value Date   ALT 28 05/16/2018   AST 23 05/16/2018   ALKPHOS 51 05/16/2018   BILITOT 0.6 05/16/2018  Currently on Lipitor 10 mg daily, started last July (elevated ASCVD risk score -17%)  did not have follow up testing.  No new side effects on lipitor..    Request dermatology referral. Fair skin, hx of sunburns.  Brother had areas removed - unknown if skin cancer or not. Brother deceased form diabetes complications. No new moles.   Review of Systems Per HPi.      Objective:   Physical Exam Vitals signs reviewed.  Constitutional:      Appearance: He is well-developed.  HENT:     Head: Normocephalic and atraumatic.  Eyes:     Pupils: Pupils are equal, round, and reactive to light.  Neck:     Vascular: No carotid bruit or JVD.  Cardiovascular:     Rate and Rhythm: Normal rate and regular rhythm.     Heart sounds: Normal heart sounds. No murmur.  Pulmonary:     Effort: Pulmonary effort is normal.     Breath sounds: Normal breath sounds. No rales.  Skin:    General: Skin is warm and dry.  Neurological:     Mental Status: He is alert and oriented to person, place, and time.    Vitals:   12/25/18 1139 12/25/18 1149  BP: (!) 173/111 (!) 152/98  Pulse: (!) 110 86  Resp: 17   Temp: 98.6 F (37 C)   TempSrc: Oral   SpO2: 98%   Weight: 231 lb (104.8 kg)   Height: 5\' 11"  (1.803 m)        Assessment & Plan:    Joel Salas is a 53 y.o. male Essential hypertension - Plan: amLODipine (NORVASC) 5 MG tablet  -Decreased control,  check labs, adjusted Norvasc to 5 mg daily.  Recheck 3 months, sooner if persistent elevations.  -Counseled on peak heart rate and recommendations with exercise, ideally 60 to 80% or so of peak heart rate for now as guidance, as he likely is over exercising.  If he does want to push it further, would recommend stress testing first or evaluation with cardiology  Need for hepatitis C screening test - Plan: Hepatitis C antibody  Screening for colon cancer - Plan: Ambulatory referral to Gastroenterology  Need for influenza vaccination - Plan: Flu Vaccine QUAD 36+ mos IM  Hyperlipidemia, unspecified hyperlipidemia type - Plan: Lipid panel, Comprehensive metabolic panel, atorvastatin (LIPITOR) 10 MG tablet  - Stable, tolerating current regimen. Medications refilled. Labs pending as above.   Prediabetes - Plan: Hemoglobin A1c  - weight increased. Has been exercising, likely dietary indiscretion.   - repeat  A1c, recheck OV in 3 months  Screening for skin cancer - Plan: Ambulatory referral to Dermatology  - establish care/routine screening requested.   Meds ordered this encounter  Medications  . amLODipine (NORVASC) 5 MG tablet    Sig: Take 1 tablet (5 mg total) by mouth daily.    Dispense:  90 tablet    Refill:  1  . atorvastatin (LIPITOR) 10 MG tablet    Sig: TAKE 1 TABLET(10 MG) BY MOUTH DAILY    Dispense:  90 tablet    Refill:  1   Patient Instructions      I will check some screening tests, and refer you to dermatology.  Increase amlodipine to 5 mg.  recehck 3 months. Sooner if blood pressure remains high.  See info on prediabetes and diet.  Try to keep exercise in low to moderate intensity (no more than 60-80% peak heart rate - about 130-140). I am happy to refer you to cardiology for stress testing if increased exercise considered.      Prediabetes Eating Plan Prediabetes is a condition that causes blood sugar (glucose) levels to be higher than normal. This increases the risk for developing diabetes. In order to prevent diabetes from developing, your health care provider may recommend a diet and other lifestyle changes to help you:  Control your blood glucose levels.  Improve your cholesterol levels.  Manage your blood pressure. Your health care provider may recommend working with a diet and nutrition specialist (dietitian) to make a meal plan that is best for you. What are tips for following this plan? Lifestyle  Set weight loss goals with the help of your health care team. It is recommended that most people with prediabetes lose 7% of their current body weight.  Exercise for at least 30 minutes at least 5 days a week.  Attend a support group or seek ongoing support from a mental health counselor.  Take over-the-counter and prescription medicines only as told by your health care provider. Reading food labels  Read food labels to check the amount of fat, salt  (sodium), and sugar in prepackaged foods. Avoid foods that have: ? Saturated fats. ? Trans fats. ? Added sugars.  Avoid foods that have more than 300 milligrams (mg) of sodium per serving. Limit your daily sodium intake to less than 2,300 mg each day. Shopping  Avoid buying pre-made and processed foods. Cooking  Cook with olive oil. Do not use butter, lard, or ghee.  Bake, broil, grill, or boil foods. Avoid frying. Meal planning   Work with your dietitian to develop an eating plan that is right for  you. This may include: ? Tracking how many calories you take in. Use a food diary, notebook, or mobile application to track what you eat at each meal. ? Using the glycemic index (GI) to plan your meals. The index tells you how quickly a food will raise your blood glucose. Choose low-GI foods. These foods take a longer time to raise blood glucose.  Consider following a Mediterranean diet. This diet includes: ? Several servings each day of fresh fruits and vegetables. ? Eating fish at least twice a week. ? Several servings each day of whole grains, beans, nuts, and seeds. ? Using olive oil instead of other fats. ? Moderate alcohol consumption. ? Eating small amounts of red meat and whole-fat dairy.  If you have high blood pressure, you may need to limit your sodium intake or follow a diet such as the DASH eating plan. DASH is an eating plan that aims to lower high blood pressure. What foods are recommended? The items listed below may not be a complete list. Talk with your dietitian about what dietary choices are best for you. Grains Whole grains, such as whole-wheat or whole-grain breads, crackers, cereals, and pasta. Unsweetened oatmeal. Bulgur. Barley. Quinoa. Brown rice. Corn or whole-wheat flour tortillas or taco shells. Vegetables Lettuce. Spinach. Peas. Beets. Cauliflower. Cabbage. Broccoli. Carrots. Tomatoes. Squash. Eggplant. Herbs. Peppers. Onions. Cucumbers. Brussels  sprouts. Fruits Berries. Bananas. Apples. Oranges. Grapes. Papaya. Mango. Pomegranate. Kiwi. Grapefruit. Cherries. Meats and other protein foods Seafood. Poultry without skin. Lean cuts of pork and beef. Tofu. Eggs. Nuts. Beans. Dairy Low-fat or fat-free dairy products, such as yogurt, cottage cheese, and cheese. Beverages Water. Tea. Coffee. Sugar-free or diet soda. Seltzer water. Lowfat or no-fat milk. Milk alternatives, such as soy or almond milk. Fats and oils Olive oil. Canola oil. Sunflower oil. Grapeseed oil. Avocado. Walnuts. Sweets and desserts Sugar-free or low-fat pudding. Sugar-free or low-fat ice cream and other frozen treats. Seasoning and other foods Herbs. Sodium-free spices. Mustard. Relish. Low-fat, low-sugar ketchup. Low-fat, low-sugar barbecue sauce. Low-fat or fat-free mayonnaise. What foods are not recommended? The items listed below may not be a complete list. Talk with your dietitian about what dietary choices are best for you. Grains Refined white flour and flour products, such as bread, pasta, snack foods, and cereals. Vegetables Canned vegetables. Frozen vegetables with butter or cream sauce. Fruits Fruits canned with syrup. Meats and other protein foods Fatty cuts of meat. Poultry with skin. Breaded or fried meat. Processed meats. Dairy Full-fat yogurt, cheese, or milk. Beverages Sweetened drinks, such as sweet iced tea and soda. Fats and oils Butter. Lard. Ghee. Sweets and desserts Baked goods, such as cake, cupcakes, pastries, cookies, and cheesecake. Seasoning and other foods Spice mixes with added salt. Ketchup. Barbecue sauce. Mayonnaise. Summary  To prevent diabetes from developing, you may need to make diet and other lifestyle changes to help control blood sugar, improve cholesterol levels, and manage your blood pressure.  Set weight loss goals with the help of your health care team. It is recommended that most people with prediabetes lose 7  percent of their current body weight.  Consider following a Mediterranean diet that includes plenty of fresh fruits and vegetables, whole grains, beans, nuts, seeds, fish, lean meat, low-fat dairy, and healthy oils. This information is not intended to replace advice given to you by your health care provider. Make sure you discuss any questions you have with your health care provider. Document Released: 03/07/2015 Document Revised: 12/25/2016 Document Reviewed: 12/25/2016 Elsevier Interactive  Patient Education  [employer].    If you have lab work done today you will be contacted with your lab results within the next 2 weeks.  If you have not heard from [place] then please contact us. The fastest way to get your results is to register for My Chart.   IF you received an x-ray today, you will receive an invoice from [facility]. Please contact [facility] at [phone] with questions or concerns regarding your invoice.   IF you received labwork today, you will receive an invoice from [facility]. Please contact [facility] at [phone] with questions or concerns regarding your invoice.   Our billing staff will not be able to assist you with questions regarding bills from these companies.  You will be contacted with the lab results as soon as they are available. The fastest way to get your results is to activate your My Chart account. Instructions are located on the last page of this paperwork. If you have not heard from [place] regarding the results in 2 weeks, please contact this office.       Signed,   Merri Ray, MD [facility].  12/26/18 2:02 PM

## 2018-12-26 LAB — COMPREHENSIVE METABOLIC PANEL
ALT: 68 IU/L — ABNORMAL HIGH (ref 0–44)
AST: 37 IU/L (ref 0–40)
Albumin/Globulin Ratio: 2.3 — ABNORMAL HIGH (ref 1.2–2.2)
Albumin: 5 g/dL — ABNORMAL HIGH (ref 3.8–4.9)
Alkaline Phosphatase: 59 IU/L (ref 39–117)
BUN/Creatinine Ratio: 9 (ref 9–20)
BUN: 12 mg/dL (ref 6–24)
Bilirubin Total: 0.8 mg/dL (ref 0.0–1.2)
CO2: 22 mmol/L (ref 20–29)
Calcium: 10.1 mg/dL (ref 8.7–10.2)
Chloride: 103 mmol/L (ref 96–106)
Creatinine, Ser: 1.35 mg/dL — ABNORMAL HIGH (ref 0.76–1.27)
GFR calc Af Amer: 68 mL/min/{1.73_m2} (ref >59)
GFR calc non Af Amer: 59 mL/min/{1.73_m2} — ABNORMAL LOW (ref >59)
Globulin, Total: 2.2 g/dL (ref 1.5–4.5)
Glucose: 97 mg/dL (ref 65–99)
Potassium: 4.4 mmol/L (ref 3.5–5.2)
Sodium: 143 mmol/L (ref 134–144)
Total Protein: 7.2 g/dL (ref 6.0–8.5)

## 2018-12-26 LAB — LIPID PANEL
Chol/HDL Ratio: 3.9 ratio (ref 0.0–5.0)
Cholesterol, Total: 158 mg/dL (ref 100–199)
HDL: 41 mg/dL (ref >39)
LDL Calculated: 84 mg/dL (ref 0–99)
Triglycerides: 165 mg/dL — ABNORMAL HIGH (ref 0–149)
VLDL Cholesterol Cal: 33 mg/dL (ref 5–40)

## 2018-12-26 LAB — HEPATITIS C ANTIBODY: Hep C Virus Ab: 0.6 s/co ratio (ref 0.0–0.9)

## 2018-12-26 LAB — HEMOGLOBIN A1C
Est. average glucose Bld gHb Est-mCnc: 126 mg/dL
Hgb A1c MFr Bld: 6 % — ABNORMAL HIGH (ref 4.8–5.6)

## 2018-12-30 ENCOUNTER — Encounter: Payer: Self-pay | Admitting: Family Medicine

## 2019-01-02 ENCOUNTER — Encounter: Payer: Self-pay | Admitting: Gastroenterology

## 2019-01-06 ENCOUNTER — Encounter: Payer: Self-pay | Admitting: Gastroenterology

## 2019-01-15 ENCOUNTER — Other Ambulatory Visit: Payer: Self-pay

## 2019-01-15 ENCOUNTER — Encounter: Payer: Self-pay | Admitting: Gastroenterology

## 2019-01-15 ENCOUNTER — Ambulatory Visit (AMBULATORY_SURGERY_CENTER): Payer: Self-pay

## 2019-01-15 VITALS — Ht 71.0 in | Wt 228.0 lb

## 2019-01-15 DIAGNOSIS — Z860100 Personal history of colon polyps, unspecified: Secondary | ICD-10-CM

## 2019-01-15 MED ORDER — NA SULFATE-K SULFATE-MG SULF 17.5-3.13-1.6 GM/177ML PO SOLN: 1.0000 | Freq: Once | ORAL | 0 refills | Status: AC

## 2019-01-15 NOTE — Progress Notes (Signed)
Denies allergies to eggs or soy products. Denies complication of anesthesia or sedation. Denies use of weight loss medication. Denies use of O2.   Emmi instructions declined.   Patients temperature was 98.4 Patient states that he has not traveled internationally and he has not experienced any signs or symptoms of illness.

## 2019-01-17 ENCOUNTER — Other Ambulatory Visit: Payer: Self-pay | Admitting: Family Medicine

## 2019-01-17 DIAGNOSIS — J4520 Mild intermittent asthma, uncomplicated: Secondary | ICD-10-CM

## 2019-01-22 ENCOUNTER — Telehealth: Payer: Self-pay | Admitting: *Deleted

## 2019-01-22 NOTE — Telephone Encounter (Signed)
Appointment rescheduled for 02-23-19 at 10:30.  New instructions sent via MyChart.

## 2019-01-25 ENCOUNTER — Encounter: Payer: BLUE CROSS/BLUE SHIELD | Admitting: Gastroenterology

## 2019-02-05 ENCOUNTER — Other Ambulatory Visit: Payer: Self-pay

## 2019-02-05 ENCOUNTER — Encounter: Payer: Self-pay | Admitting: Family Medicine

## 2019-02-05 ENCOUNTER — Telehealth (INDEPENDENT_AMBULATORY_CARE_PROVIDER_SITE_OTHER): Payer: 59 | Admitting: Family Medicine

## 2019-02-05 DIAGNOSIS — M5412 Radiculopathy, cervical region: Secondary | ICD-10-CM

## 2019-02-05 MED ORDER — PREDNISONE 20 MG PO TABS: 40.0000 mg | ORAL_TABLET | Freq: Every day | ORAL | 0 refills | Status: DC

## 2019-02-05 MED ORDER — CYCLOBENZAPRINE HCL 5 MG PO TABS: 5.0000 mg | ORAL_TABLET | Freq: Three times a day (TID) | ORAL | 1 refills | Status: DC | PRN
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
## 2019-02-05 NOTE — Patient Instructions (Signed)
I suspect your symptoms are due to a cervical radiculopathy or pinched nerve coming from the neck into the shoulder blade and down the arm.  Try the muscle relaxant Flexeril up to 3 times per day, watch out for lightheadedness, dizziness or sedation with that medicine.  Additionally you can apply some heat or ice to the neck and upper shoulder blade area with gentle range of motion and stretches to see if that helps.  If no improvement in the next 1 to 2 days, then can start prednisone.  Watch for new side effects as we discussed on that medicine, but if symptoms or not improving after the next week, let me know.  Return to the clinic or go to the nearest emergency room if any of your symptoms worsen or new symptoms occur.   Cervical Radiculopathy  Cervical radiculopathy happens when a nerve in the neck (cervical nerve) is pinched or bruised. This condition can develop because of an injury or as part of the normal aging process. Pressure on the cervical nerves can cause pain or numbness that runs from the neck all the way down into the arm and fingers. Usually, this condition gets better with rest. Treatment may be needed if the condition does not improve. What are the causes? This condition may be caused by:  Injury.  Slipped (herniated) disk.  Muscle tightness in the neck because of overuse.  Arthritis.  Breakdown or degeneration in the bones and joints of the spine (spondylosis) due to aging.  Bone spurs that may develop near the cervical nerves. What are the signs or symptoms? Symptoms of this condition include:  Pain that runs from the neck to the arm and hand. The pain can be severe or irritating. It may be worse when the neck is moved.  Numbness or weakness in the affected arm and hand. How is this diagnosed? This condition may be diagnosed based on symptoms, medical history, and a physical exam. You may also have tests, including:  X-rays.  CT scan.  MRI.  Electromyogram  (EMG).  Nerve conduction tests. How is this treated? In many cases, treatment is not needed for this condition. With rest, the condition usually gets better over time. If treatment is needed, options may include:  Wearing a soft neck collar for short periods of time.  Physical therapy to strengthen your neck muscles.  Medicines, such as NSAIDs, oral corticosteroids, or spinal injections.  Surgery. This may be needed if other treatments do not help. Various types of surgery may be done depending on the cause of your problems. Follow these instructions at home: Managing pain  Take over-the-counter and prescription medicines only as told by your health care provider.  If directed, apply ice to the affected area. ? Put ice in a plastic bag. ? Place a towel between your skin and the bag. ? Leave the ice on for 20 minutes, 2-3 times per day.  If ice does not help, you can try using heat. Take a warm shower or warm bath, or use a heat pack as told by your health care provider.  Try a gentle neck and shoulder massage to help relieve symptoms. Activity  Rest as needed. Follow instructions from your health care provider about any restrictions on activities.  Do stretching and strengthening exercises as told by your health care provider or physical therapist. General instructions  If you were given a soft collar, wear it as told by your health care provider.  Use a flat pillow when  you sleep.  Keep all follow-up visits as told by your health care provider. This is important. Contact a health care provider if:  Your condition does not improve with treatment. Get help right away if:  Your pain gets much worse and cannot be controlled with medicines.  You have weakness or numbness in your hand, arm, face, or leg.  You have a high fever.  You have a stiff, rigid neck.  You lose control of your bowels or your bladder (have incontinence).  You have trouble with walking, balance,  or speaking. This information is not intended to replace advice given to you by your health care provider. Make sure you discuss any questions you have with your health care provider. Document Released: 07/16/2001 Document Revised: 03/28/2016 Document Reviewed: 12/15/2014 Elsevier Interactive Patient Education  [employer].

## 2019-02-05 NOTE — Progress Notes (Signed)
Pt c/o back pain between shoulders for a year and is now radiating to left arm to pinky finger for about a week. Its a sharp shooting pain that sometimes burns. No travel.

## 2019-02-10 ENCOUNTER — Telehealth: Payer: Self-pay | Admitting: *Deleted

## 2019-02-10 NOTE — Telephone Encounter (Signed)
Called to notify patient of cancellation of colonoscopy scheduled 02/23/19 with Dr. Fuller Plan due to Covid-19

## 2019-02-23 ENCOUNTER — Encounter: Payer: 59 | Admitting: Gastroenterology

## 2019-03-08 ENCOUNTER — Telehealth: Payer: Self-pay | Admitting: *Deleted

## 2019-03-08 NOTE — Telephone Encounter (Signed)
Attempted to call patient to reschedule previously cancelled colonoscopy due to Covid-19. Left message for pt to call back and ask for Olivia Mackie, instructions need to be redone and mailed.

## 2019-03-09 NOTE — Telephone Encounter (Signed)
Pt return call °

## 2019-03-10 NOTE — Telephone Encounter (Signed)
Returned patient's call back. Offered remaining May appointments. Pt states he cannot come until June. Advised that when June schedule opens we will call back to reschedule.

## 2019-03-15 ENCOUNTER — Telehealth: Payer: Self-pay | Admitting: *Deleted

## 2019-03-15 NOTE — Telephone Encounter (Signed)
Rescheduled patient to Friday 04/09/19 at 1030 am. Pt has prep. Instructions redone and sent to patient via MyChart.

## 2019-03-19 ENCOUNTER — Encounter: Payer: Self-pay | Admitting: Family Medicine

## 2019-04-07 ENCOUNTER — Telehealth: Payer: Self-pay | Admitting: *Deleted

## 2019-04-07 NOTE — Telephone Encounter (Signed)

## 2019-04-09 ENCOUNTER — Other Ambulatory Visit: Payer: Self-pay

## 2019-04-09 ENCOUNTER — Encounter: Payer: Self-pay | Admitting: Gastroenterology

## 2019-04-09 ENCOUNTER — Ambulatory Visit (AMBULATORY_SURGERY_CENTER): Payer: 59 | Admitting: Gastroenterology

## 2019-04-09 VITALS — BP 117/84 | HR 62 | Temp 98.2°F | Resp 13 | Ht 71.0 in | Wt 228.0 lb

## 2019-04-09 DIAGNOSIS — K635 Polyp of colon: Secondary | ICD-10-CM | POA: Diagnosis not present

## 2019-04-09 DIAGNOSIS — D123 Benign neoplasm of transverse colon: Secondary | ICD-10-CM

## 2019-04-09 DIAGNOSIS — Z86010 Personal history of colonic polyps: Secondary | ICD-10-CM

## 2019-04-09 MED ORDER — SODIUM CHLORIDE 0.9 % IV SOLN: 500.0000 mL | Freq: Once | INTRAVENOUS | Status: AC

## 2019-04-09 NOTE — Patient Instructions (Signed)
Impression/Recommendations:  Polyp handout given to patient. Diverticulosis handout given to patient. Hemorrhoid handout given to patient. High fiber diet handout given to patient.  Repeat colonoscopy in 5 years for surveillance. Continue present medications.  Await pathology results.  YOU HAD AN ENDOSCOPIC PROCEDURE TODAY AT [facility]:   Refer to the procedure report that was given to you for any specific questions about what was found during the examination.  If the procedure report does not answer your questions, please call your gastroenterologist to clarify.  If you requested that your care partner not be given the details of your procedure findings, then the procedure report has been included in a sealed envelope for you to review at your convenience later.  YOU SHOULD EXPECT: Some feelings of bloating in the abdomen. Passage of more gas than usual.  Walking can help get rid of the air that was put into your GI tract during the procedure and reduce the bloating. If you had a lower endoscopy (such as a colonoscopy or flexible sigmoidoscopy) you may notice spotting of blood in your stool or on the toilet paper. If you underwent a bowel prep for your procedure, you may not have a normal bowel movement for a few days.  Please Note:  You might notice some irritation and congestion in your nose or some drainage.  This is from the oxygen used during your procedure.  There is no need for concern and it should clear up in a day or so.  SYMPTOMS TO REPORT IMMEDIATELY:   Following lower endoscopy (colonoscopy or flexible sigmoidoscopy):  Excessive amounts of blood in the stool  Significant tenderness or worsening of abdominal pains  Swelling of the abdomen that is new, acute  Fever of 100F or higher For urgent or emergent issues, a gastroenterologist can be reached at any hour by calling [phone].   DIET:  We do recommend a small meal at first, but then you may  proceed to your regular diet.  Drink plenty of fluids but you should avoid alcoholic beverages for 24 hours.  ACTIVITY:  You should plan to take it easy for the rest of today and you should NOT DRIVE or use heavy machinery until tomorrow (because of the sedation medicines used during the test).    FOLLOW UP: Our staff will call the number listed on your records 48-72 hours following your procedure to check on you and address any questions or concerns that you may have regarding the information given to you following your procedure. If we do not reach you, we will leave a message.  We will attempt to reach you two times.  During this call, we will ask if you have developed any symptoms of COVID 19. If you develop any symptoms (ie: fever, flu-like symptoms, shortness of breath, cough etc.) before then, please call [phone].  If you test positive for Covid 19 in the 2 weeks post procedure, please call and report this information to [place].    If any biopsies were taken you will be contacted by phone or by letter within the next 1-3 weeks.  Please call us at [phone] if you have not heard about the biopsies in 3 weeks.    SIGNATURES/CONFIDENTIALITY: You and/or your care partner have signed paperwork which will be entered into your electronic medical record.  These signatures attest to the fact that that the information above on your After Visit Summary has been reviewed and is understood.  Full responsibility of the  confidentiality of this discharge information lies with you and/or your care-partner. 

## 2019-04-09 NOTE — Op Note (Signed)
[facility] Patient Name: Joel Salas Procedure Date: 04/09/2019 11:00 AM MRN: [id number] Endoscopist: Ladene Artist , MD Age: 53 Referring MD:  Date of Birth: 1966-09-17 Gender: Male Account #: [id number] Procedure:                Colonoscopy Indications:              High risk colon cancer surveillance: Personal                            history of sessile serrated colon polyp (less than                            10 mm in size) with no dysplasia Medicines:                Monitored Anesthesia Care Procedure:                Pre-Anesthesia Assessment:                           - Prior to the procedure, a History and Physical                            was performed, and patient medications and                            allergies were reviewed. The patient's tolerance of                            previous anesthesia was also reviewed. The risks                            and benefits of the procedure and the sedation                            options and risks were discussed with the patient.                            All questions were answered, and informed consent                            was obtained. Prior Anticoagulants: The patient has                            taken no previous anticoagulant or antiplatelet                            agents. ASA Grade Assessment: II - A patient with                            mild systemic disease. After reviewing the risks                            and benefits, the patient was deemed in  satisfactory condition to undergo the procedure.                           After obtaining informed consent, the colonoscope                            was passed under direct vision. Throughout the                            procedure, the patient's blood pressure, pulse, and                            oxygen saturations were monitored continuously. The                            Colonoscope was introduced  through the anus and                            advanced to the the cecum, identified by                            appendiceal orifice and ileocecal valve. The                            ileocecal valve, appendiceal orifice, and rectum                            were photographed. The quality of the bowel                            preparation was good. The colonoscopy was performed                            without difficulty. The patient tolerated the                            procedure well. Scope In: 11:07:36 AM Scope Out: 11:19:31 AM Scope Withdrawal Time: 0 hours 10 minutes 23 seconds  Total Procedure Duration: 0 hours 11 minutes 55 seconds  Findings:                 The perianal and digital rectal examinations were                            normal.                           A 8 mm polyp was found in the transverse colon. The                            polyp was sessile. The polyp was removed with a                            cold snare. Resection and retrieval were complete.  Multiple small-mouthed diverticula were found in                            the left colon. There was evidence of diverticular                            spasm. Erythema was seen in association with the                            diverticular opening.                           Internal hemorrhoids were found during                            retroflexion. The hemorrhoids were small and Grade                            I (internal hemorrhoids that do not prolapse).                           The exam was otherwise without abnormality on                            direct and retroflexion views. Complications:            No immediate complications. Estimated blood loss:                            None. Estimated Blood Loss:     Estimated blood loss: none. Impression:               - One 8 mm polyp in the transverse colon, removed                            with a cold snare. Resected  and retrieved.                           - Moderate diverticulosis in the left colon.                           - Internal hemorrhoids.                           - The examination was otherwise normal on direct                            and retroflexion views. Recommendation:           - Repeat colonoscopy in 5 years for surveillance.                           - Patient has a contact number available for                            emergencies. The signs and symptoms of potential  delayed complications were discussed with the                            patient. Return to normal activities tomorrow.                            Written discharge instructions were provided to the                            patient.                           - High fiber diet.                           - Continue present medications.                           - Await pathology results. Ladene Artist, MD 04/09/2019 11:24:50 AM This report has been signed electronically.

## 2019-04-09 NOTE — Progress Notes (Signed)
Report given to PACU, vss 

## 2019-04-13 ENCOUNTER — Telehealth: Payer: Self-pay

## 2019-04-13 NOTE — Telephone Encounter (Signed)
No answer, left message to call if having any issues or concerns, B.Arnaldo Heffron RN 

## 2019-04-13 NOTE — Telephone Encounter (Signed)
No answer, left message to call back later, B.Durand Wittmeyer RN. 

## 2019-04-19 ENCOUNTER — Encounter: Payer: Self-pay | Admitting: Gastroenterology

## 2019-06-15 ENCOUNTER — Other Ambulatory Visit: Payer: Self-pay | Admitting: Family Medicine

## 2019-06-15 DIAGNOSIS — I10 Essential (primary) hypertension: Secondary | ICD-10-CM

## 2019-06-15 NOTE — Telephone Encounter (Signed)
Requested Prescriptions  Pending Prescriptions Disp Refills  . amLODipine (NORVASC) 5 MG tablet [Pharmacy Med Name: AMLODIPINE BESYLATE 5 MG TAB] 30 tablet 0    Sig: TAKE ONE TABLET BY MOUTH ONCE DAILY     Cardiovascular:  Calcium Channel Blockers Failed - 06/15/2019  9:31 AM      Failed - Valid encounter within last 6 months    Recent Outpatient Visits          5 months ago Essential hypertension   [facility], Ranell Patrick, MD   1 year ago Annual physical exam   [facility], Ranell Patrick, MD   2 years ago Annual physical exam   [facility], Ranell Patrick, MD   3 years ago Shingles   [facility], Ranell Patrick, MD   3 years ago Syncope and collapse   [facility], Laurey Arrow, MD             Passed - Last BP in normal range    BP Readings from Last 1 Encounters:  04/09/19 117/84         Patient was due for follow up in May with Dr. Carlota Raspberry.  Left patient a voicemail to call [facility] to schedule a follow up appointment.  30 day courtesy refill sent.
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
## 2019-06-25 ENCOUNTER — Ambulatory Visit: Payer: 59 | Admitting: Family Medicine

## 2019-06-25 ENCOUNTER — Encounter: Payer: Self-pay | Admitting: Family Medicine

## 2019-06-25 ENCOUNTER — Other Ambulatory Visit: Payer: Self-pay

## 2019-06-25 VITALS — BP 131/83 | HR 78 | Temp 98.3°F | Resp 16 | Ht 71.0 in | Wt 221.4 lb

## 2019-06-25 DIAGNOSIS — J4520 Mild intermittent asthma, uncomplicated: Secondary | ICD-10-CM

## 2019-06-25 DIAGNOSIS — E785 Hyperlipidemia, unspecified: Secondary | ICD-10-CM

## 2019-06-25 DIAGNOSIS — Z23 Encounter for immunization: Secondary | ICD-10-CM

## 2019-06-25 DIAGNOSIS — R002 Palpitations: Secondary | ICD-10-CM

## 2019-06-25 DIAGNOSIS — F439 Reaction to severe stress, unspecified: Secondary | ICD-10-CM

## 2019-06-25 DIAGNOSIS — I10 Essential (primary) hypertension: Secondary | ICD-10-CM | POA: Diagnosis not present

## 2019-06-25 MED ORDER — ATORVASTATIN CALCIUM 10 MG PO TABS: ORAL_TABLET | ORAL | 2 refills | Status: DC

## 2019-06-25 MED ORDER — AMLODIPINE BESYLATE 5 MG PO TABS: 5.0000 mg | ORAL_TABLET | Freq: Every day | ORAL | 2 refills | Status: DC

## 2019-06-25 MED ORDER — FLOVENT HFA 110 MCG/ACT IN AERO: 1.0000 | INHALATION_SPRAY | Freq: Two times a day (BID) | RESPIRATORY_TRACT | 3 refills | Status: DC

## 2019-06-25 MED ORDER — ALBUTEROL SULFATE HFA 108 (90 BASE) MCG/ACT IN AERS: INHALATION_SPRAY | RESPIRATORY_TRACT | 0 refills | Status: DC
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
## 2019-06-25 NOTE — Patient Instructions (Addendum)
Avoid nsaids like advil/alleve. Should monitor levels for improvement over next 6 months. No change in other meds today.   Try flovent during allergy season, but can still use albuterol as needed.   Try to cut back on caffeine - 4-5 cups. See other info on stress management. If return of heart palpitations, or prior symptoms - follow up to look into other causes.   Return to the clinic or go to the nearest emergency room if any of your symptoms worsen or new symptoms occur.    Stress Stress is a normal reaction to life events. Stress is what you feel when life demands more than you are used to, or more than you think you can handle. Some stress can be useful, such as studying for a test or meeting a deadline at work. Stress that occurs too often or for too long can cause problems. It can affect your emotional health and interfere with relationships and normal daily activities. Too much stress can weaken your body's defense system (immune system) and increase your risk for physical illness. If you already have a medical problem, stress can make it worse. What are the causes? All sorts of life events can cause stress. An event that causes stress for one person may not be stressful for another person. Major life events, whether positive or negative, commonly cause stress. Examples include:  Losing a job or starting a new job.  Losing a loved one.  Moving to a new town or home.  Getting married or divorced.  Having a baby.  Injury or illness. Less obvious life events can also cause stress, especially if they occur day after day or in combination with each other. Examples include:  Working long hours.  Driving in traffic.  Caring for children.  Being in debt.  Being in a difficult relationship. What are the signs or symptoms? Stress can cause emotional symptoms, including:  Anxiety. This is feeling worried, afraid, on edge, overwhelmed, or out of control.  Anger, including  irritation or impatience.  Depression. This is feeling sad, down, helpless, or guilty.  Trouble focusing, remembering, or making decisions. Stress can cause physical symptoms, including:  Aches and pains. These may affect your head, neck, back, stomach, or other areas of your body.  Tight muscles or a clenched jaw.  Low energy.  Trouble sleeping. Stress can cause unhealthy behaviors, including:  Eating to feel better (overeating) or skipping meals.  Working too much or putting off tasks.  Smoking, drinking alcohol, or using drugs to feel better. How is this diagnosed? Stress is diagnosed through an assessment by your health care provider. He or she may diagnose this condition based on:  Your symptoms and any stressful life events.  Your medical history.  Tests to rule out other causes of your symptoms. Depending on your condition, your health care provider may refer you to a specialist for further evaluation. How is this treated?  Stress management techniques are the recommended treatment for stress. Medicine is not typically recommended for the treatment of stress. Techniques to reduce your reaction to stressful life events include:  Stress identification. Monitor yourself for symptoms of stress and identify what causes stress for you. These skills may help you to avoid or prepare for stressful events.  Time management. Set your priorities, keep a calendar of events, and learn to say "no." Taking these actions can help you avoid making too many commitments. Techniques for coping with stress include:  Rethinking the problem. Try to think  realistically about stressful events rather than ignoring them or overreacting. Try to find the positives in a stressful situation rather than focusing on the negatives.  Exercise. Physical exercise can release both physical and emotional tension. The key is to find a form of exercise that you enjoy and do it regularly.  Relaxation  techniques. These relax the body and mind. The key is to find one or more that you enjoy and use the technique(s) regularly. Examples include: ? Meditation, deep breathing, or progressive relaxation techniques. ? Yoga or tai chi. ? Biofeedback, mindfulness techniques, or journaling. ? Listening to music, being out in nature, or participating in other hobbies.  Practicing a healthy lifestyle. Eat a balanced diet, drink plenty of water, limit or avoid caffeine, and get plenty of sleep.  Having a strong support network. Spend time with family, friends, or other people you enjoy being around. Express your feelings and talk things over with someone you trust. Counseling or talk therapy with a mental health professional may be helpful if you are having trouble managing stress on your own. Follow these instructions at home: Lifestyle   Avoid drugs.  Do not use any products that contain nicotine or tobacco, such as cigarettes and e-cigarettes. If you need help quitting, ask your health care provider.  Limit alcohol intake to no more than 1 drink a day for nonpregnant women and 2 drinks a day for men. One drink equals 12 oz of beer, 5 oz of wine, or 1 oz of hard liquor.  Do not use alcohol or drugs to relax.  Eat a balanced diet that includes fresh fruits and vegetables, whole grains, lean meats, fish, eggs, and beans, and low-fat dairy. Avoid processed foods and foods high in added fat, sugar, and salt.  Exercise at least 30 minutes on 5 or more days each week.  Get 7-8 hours of sleep each night. General instructions   Practice stress management techniques as discussed with your health care provider.  Drink enough fluid to keep your urine clear or pale yellow.  Take over-the-counter and prescription medicines only as told by your health care provider.  Keep all follow-up visits as told by your health care provider. This is important. Contact a health care provider if:  Your symptoms  get worse.  You have new symptoms.  You feel overwhelmed by your problems and can no longer manage them on your own. Get help right away if:  You have thoughts of hurting yourself or others. If you ever feel like you may hurt yourself or others, or have thoughts about taking your own life, get help right away. You can go to your nearest emergency department or call:  Your local emergency services (911 in the [place]  A suicide crisis helpline, such as the [facility] at (859)230-1867. This is open 24 hours a day. Summary  Stress is a normal reaction to life events. It can cause problems if it happens too often or for too long.  Practicing stress management techniques is the best way to treat stress.  Counseling or talk therapy with a mental health professional may be helpful if you are having trouble managing stress on your own. This information is not intended to replace advice given to you by your health care provider. Make sure you discuss any questions you have with your health care provider. Document Released: 04/16/2001 Document Revised: 10/03/2017 Document Reviewed: 12/11/2016 Elsevier Patient Education  [employer].     If you have  lab work done today you will be contacted with your lab results within the next 2 weeks.  If you have not heard from [place] then please contact us. The fastest way to get your results is to register for My Chart.   IF you received an x-ray today, you will receive an invoice from [facility]. Please contact [facility] at [phone] with questions or concerns regarding your invoice.   IF you received labwork today, you will receive an invoice from [facility]. Please contact [facility] at [phone] with questions or concerns regarding your invoice.   Our billing staff will not be able to assist you with questions regarding bills from these companies.  You will be contacted with the lab results as soon  as they are available. The fastest way to get your results is to activate your My Chart account. Instructions are located on the last page of this paperwork. If you have not heard from [place] regarding the results in 2 weeks, please contact this office.

## 2019-06-26 LAB — COMPREHENSIVE METABOLIC PANEL
ALT: 32 IU/L (ref 0–44)
AST: 26 IU/L (ref 0–40)
Albumin/Globulin Ratio: 2.5 — ABNORMAL HIGH (ref 1.2–2.2)
Albumin: 5 g/dL — ABNORMAL HIGH (ref 3.8–4.9)
Alkaline Phosphatase: 52 IU/L (ref 39–117)
BUN/Creatinine Ratio: 13 (ref 9–20)
BUN: 15 mg/dL (ref 6–24)
Bilirubin Total: 0.8 mg/dL (ref 0.0–1.2)
CO2: 19 mmol/L — ABNORMAL LOW (ref 20–29)
Calcium: 9.7 mg/dL (ref 8.7–10.2)
Chloride: 107 mmol/L — ABNORMAL HIGH (ref 96–106)
Creatinine, Ser: 1.18 mg/dL (ref 0.76–1.27)
GFR calc Af Amer: 80 mL/min/{1.73_m2} (ref >59)
GFR calc non Af Amer: 70 mL/min/{1.73_m2} (ref >59)
Globulin, Total: 2 g/dL (ref 1.5–4.5)
Glucose: 111 mg/dL — ABNORMAL HIGH (ref 65–99)
Potassium: 4.2 mmol/L (ref 3.5–5.2)
Sodium: 143 mmol/L (ref 134–144)
Total Protein: 7 g/dL (ref 6.0–8.5)

## 2019-06-26 LAB — LIPID PANEL
Chol/HDL Ratio: 4.2 ratio (ref 0.0–5.0)
Cholesterol, Total: 142 mg/dL (ref 100–199)
HDL: 34 mg/dL — ABNORMAL LOW (ref >39)
LDL Calculated: 92 mg/dL (ref 0–99)
Triglycerides: 79 mg/dL (ref 0–149)
VLDL Cholesterol Cal: 16 mg/dL (ref 5–40)

## 2019-06-26 LAB — TSH: TSH: 1.06 u[IU]/mL (ref 0.450–4.500)

## 2019-11-30 ENCOUNTER — Other Ambulatory Visit: Payer: Self-pay | Admitting: Family Medicine

## 2019-11-30 DIAGNOSIS — J4520 Mild intermittent asthma, uncomplicated: Secondary | ICD-10-CM

## 2019-12-18 ENCOUNTER — Other Ambulatory Visit: Payer: Self-pay | Admitting: Family Medicine

## 2019-12-18 DIAGNOSIS — J4520 Mild intermittent asthma, uncomplicated: Secondary | ICD-10-CM

## 2019-12-18 NOTE — Telephone Encounter (Signed)
Requested Prescriptions  Pending Prescriptions Disp Refills  . albuterol (VENTOLIN HFA) 108 (90 Base) MCG/ACT inhaler [Pharmacy Med Name: ALBUTEROL HFA (VENTOLIN) INH] 18 g 0    Sig: TAKE 2 PUFFS BY MOUTH EVERY 4 HOURS AS NEEDED     Pulmonology:  Beta Agonists Failed - 12/18/2019  9:19 AM      Failed - One inhaler should last at least one month. If the patient is requesting refills earlier, contact the patient to check for uncontrolled symptoms.      Passed - Valid encounter within last 12 months    Recent Outpatient Visits          5 months ago Mild intermittent asthma without complication   [facility], Ranell Patrick, MD   10 months ago Left cervical radiculopathy   [facility], Ranell Patrick, MD   11 months ago Essential hypertension   [facility], Ranell Patrick, MD   1 year ago Annual physical exam   [facility], Ranell Patrick, MD   2 years ago Annual physical exam   [facility], Ranell Patrick, MD      Future Appointments            In 1 week Carlota Raspberry Ranell Patrick, MD [facility] at [place], [facility]           '

## 2019-12-27 ENCOUNTER — Other Ambulatory Visit: Payer: Self-pay

## 2019-12-27 ENCOUNTER — Encounter: Payer: Self-pay | Admitting: Family Medicine

## 2019-12-27 ENCOUNTER — Ambulatory Visit (INDEPENDENT_AMBULATORY_CARE_PROVIDER_SITE_OTHER): Payer: No Typology Code available for payment source | Admitting: Family Medicine

## 2019-12-27 VITALS — BP 144/78 | HR 89 | Temp 98.3°F | Ht 71.0 in | Wt 231.0 lb

## 2019-12-27 DIAGNOSIS — I10 Essential (primary) hypertension: Secondary | ICD-10-CM

## 2019-12-27 DIAGNOSIS — J309 Allergic rhinitis, unspecified: Secondary | ICD-10-CM | POA: Diagnosis not present

## 2019-12-27 DIAGNOSIS — E785 Hyperlipidemia, unspecified: Secondary | ICD-10-CM | POA: Diagnosis not present

## 2019-12-27 DIAGNOSIS — J4520 Mild intermittent asthma, uncomplicated: Secondary | ICD-10-CM | POA: Diagnosis not present

## 2019-12-27 MED ORDER — ATORVASTATIN CALCIUM 10 MG PO TABS: ORAL_TABLET | ORAL | 2 refills | Status: DC

## 2019-12-27 MED ORDER — FLUTICASONE-SALMETEROL 250-50 MCG/DOSE IN AEPB: 1.0000 | INHALATION_SPRAY | Freq: Two times a day (BID) | RESPIRATORY_TRACT | 3 refills | Status: DC

## 2019-12-27 MED ORDER — AMLODIPINE BESYLATE 5 MG PO TABS: 5.0000 mg | ORAL_TABLET | Freq: Every day | ORAL | 2 refills | Status: AC

## 2019-12-27 MED ORDER — FLUTICASONE PROPIONATE 50 MCG/ACT NA SUSP: 2.0000 | Freq: Every day | NASAL | 6 refills | Status: DC

## 2019-12-27 NOTE — Patient Instructions (Addendum)
For improved allergy and asthma control, start Flonase nasal spray 1 to 2 sprays in each nostril once per day, and Zyrtec once per day.  For asthma stop Flovent, start Advair 1 puff twice per day, albuterol as needed.  As symptoms improve, may be able to change back to Flovent.  Recheck 6 weeks.   If you have lab work done today you will be contacted with your lab results within the next 2 weeks.  If you have not heard from [place] then please contact us. The fastest way to get your results is to register for My Chart.   IF you received an x-ray today, you will receive an invoice from [facility]. Please contact [facility] at [phone] with questions or concerns regarding your invoice.   IF you received labwork today, you will receive an invoice from [facility]. Please contact [facility] at [phone] with questions or concerns regarding your invoice.   Our billing staff will not be able to assist you with questions regarding bills from these companies.  You will be contacted with the lab results as soon as they are available. The fastest way to get your results is to activate your My Chart account. Instructions are located on the last page of this paperwork. If you have not heard from [place] regarding the results in 2 weeks, please contact this office.
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
## 2019-12-28 ENCOUNTER — Ambulatory Visit: Payer: 59 | Admitting: Family Medicine

## 2019-12-28 LAB — COMPREHENSIVE METABOLIC PANEL
ALT: 33 IU/L (ref 0–44)
AST: 20 IU/L (ref 0–40)
Albumin/Globulin Ratio: 2 (ref 1.2–2.2)
Albumin: 4.6 g/dL (ref 3.8–4.9)
Alkaline Phosphatase: 62 IU/L (ref 39–117)
BUN/Creatinine Ratio: 10 (ref 9–20)
BUN: 12 mg/dL (ref 6–24)
Bilirubin Total: 0.6 mg/dL (ref 0.0–1.2)
CO2: 22 mmol/L (ref 20–29)
Calcium: 9.9 mg/dL (ref 8.7–10.2)
Chloride: 107 mmol/L — ABNORMAL HIGH (ref 96–106)
Creatinine, Ser: 1.22 mg/dL (ref 0.76–1.27)
GFR calc Af Amer: 77 mL/min/{1.73_m2} (ref >59)
GFR calc non Af Amer: 66 mL/min/{1.73_m2} (ref >59)
Globulin, Total: 2.3 g/dL (ref 1.5–4.5)
Glucose: 127 mg/dL — ABNORMAL HIGH (ref 65–99)
Potassium: 4.7 mmol/L (ref 3.5–5.2)
Sodium: 143 mmol/L (ref 134–144)
Total Protein: 6.9 g/dL (ref 6.0–8.5)

## 2019-12-28 LAB — LIPID PANEL
Chol/HDL Ratio: 3.7 ratio (ref 0.0–5.0)
Cholesterol, Total: 145 mg/dL (ref 100–199)
HDL: 39 mg/dL — ABNORMAL LOW (ref >39)
LDL Chol Calc (NIH): 89 mg/dL (ref 0–99)
Triglycerides: 91 mg/dL (ref 0–149)
VLDL Cholesterol Cal: 17 mg/dL (ref 5–40)

## 2020-02-09 ENCOUNTER — Ambulatory Visit: Payer: No Typology Code available for payment source | Admitting: Family Medicine

## 2020-02-19 ENCOUNTER — Other Ambulatory Visit: Payer: Self-pay | Admitting: Family Medicine

## 2020-02-19 DIAGNOSIS — J4520 Mild intermittent asthma, uncomplicated: Secondary | ICD-10-CM

## 2020-02-19 NOTE — Telephone Encounter (Signed)
Requested medication (s) are due for refill today: no Pt has 2 RF left   Requested medication (s) are on the active medication list: yes  Last refill:  12/27/19  Future visit scheduled: yes  Notes to clinic:  pt requesting 90 day refill. Please review. Called pharmacy on file and pt has 2 refills from previous prescription remaining.  Requested Prescriptions  Pending Prescriptions Disp Refills   ADVAIR DISKUS 250-50 MCG/DOSE AEPB [Pharmacy Med Name: ADVAIR 250-50 DISKUS] 180 each 2    Sig: INHALE 1 PUFF INTO THE LUNGS IN THE MORNING AND AT BEDTIME.      Pulmonology:  Combination Products Passed - 02/19/2020  4:16 PM      Passed - Valid encounter within last 12 months    Recent Outpatient Visits           1 month ago Mild intermittent asthma without complication   [facility], Ranell Patrick, MD   7 months ago Mild intermittent asthma without complication   [facility], Ranell Patrick, MD   1 year ago Left cervical radiculopathy   [facility], Ranell Patrick, MD   1 year ago Essential hypertension   [facility], Ranell Patrick, MD   1 year ago Annual physical exam   [facility], Ranell Patrick, MD       Future Appointments             In 1 week Carlota Raspberry Ranell Patrick, MD [facility] at [place], [facility]

## 2020-02-28 ENCOUNTER — Ambulatory Visit: Payer: 59 | Admitting: Family Medicine

## 2020-03-10 ENCOUNTER — Encounter: Payer: Self-pay | Admitting: Registered Nurse

## 2020-03-10 ENCOUNTER — Ambulatory Visit: Payer: 59 | Admitting: Registered Nurse

## 2020-03-10 ENCOUNTER — Other Ambulatory Visit: Payer: Self-pay

## 2020-03-10 VITALS — BP 160/74 | HR 67 | Temp 98.2°F | Resp 17 | Ht 71.0 in | Wt 220.6 lb

## 2020-03-10 DIAGNOSIS — Z8639 Personal history of other endocrine, nutritional and metabolic disease: Secondary | ICD-10-CM

## 2020-03-10 DIAGNOSIS — I10 Essential (primary) hypertension: Secondary | ICD-10-CM

## 2020-03-10 DIAGNOSIS — J4520 Mild intermittent asthma, uncomplicated: Secondary | ICD-10-CM

## 2020-03-10 MED ORDER — SPIRIVA RESPIMAT 2.5 MCG/ACT IN AERS: 2.0000 | INHALATION_SPRAY | Freq: Every day | RESPIRATORY_TRACT | 11 refills | Status: AC

## 2020-03-10 MED ORDER — MONTELUKAST SODIUM 10 MG PO TABS: 10.0000 mg | ORAL_TABLET | Freq: Every day | ORAL | 3 refills | Status: AC
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
## 2020-03-10 NOTE — Patient Instructions (Signed)
° ° ° °  If you have lab work done today you will be contacted with your lab results within the next 2 weeks.  If you have not heard from us then please contact us. The fastest way to get your results is to register for My Chart. ° ° °IF you received an x-ray today, you will receive an invoice from [facility]. Please contact [facility] at [phone] with questions or concerns regarding your invoice.  ° °IF you received labwork today, you will receive an invoice from [facility]. Please contact [facility] at [phone] with questions or concerns regarding your invoice.  ° °Our billing staff will not be able to assist you with questions regarding bills from these companies. ° °You will be contacted with the lab results as soon as they are available. The fastest way to get your results is to activate your My Chart account. Instructions are located on the last page of this paperwork. If you have not heard from us regarding the results in 2 weeks, please contact this office. °  ° ° ° °

## 2020-03-11 LAB — CBC WITH DIFFERENTIAL
Basophils Absolute: 0 10*3/uL (ref 0.0–0.2)
Basos: 0 %
EOS (ABSOLUTE): 0.1 10*3/uL (ref 0.0–0.4)
Eos: 1 %
Hematocrit: 48.4 % (ref 37.5–51.0)
Hemoglobin: 17.1 g/dL (ref 13.0–17.7)
Immature Grans (Abs): 0 10*3/uL (ref 0.0–0.1)
Immature Granulocytes: 0 %
Lymphocytes Absolute: 3.8 10*3/uL — ABNORMAL HIGH (ref 0.7–3.1)
Lymphs: 42 %
MCH: 31.4 pg (ref 26.6–33.0)
MCHC: 35.3 g/dL (ref 31.5–35.7)
MCV: 89 fL (ref 79–97)
Monocytes Absolute: 0.5 10*3/uL (ref 0.1–0.9)
Monocytes: 6 %
Neutrophils Absolute: 4.6 10*3/uL (ref 1.4–7.0)
Neutrophils: 51 %
RBC: 5.44 x10E6/uL (ref 4.14–5.80)
RDW: 12.8 % (ref 11.6–15.4)
WBC: 9.1 10*3/uL (ref 3.4–10.8)

## 2020-03-11 LAB — COMPREHENSIVE METABOLIC PANEL
ALT: 32 IU/L (ref 0–44)
AST: 24 IU/L (ref 0–40)
Albumin/Globulin Ratio: 2.6 — ABNORMAL HIGH (ref 1.2–2.2)
Albumin: 5.2 g/dL — ABNORMAL HIGH (ref 3.8–4.9)
Alkaline Phosphatase: 64 IU/L (ref 39–117)
BUN/Creatinine Ratio: 15 (ref 9–20)
BUN: 18 mg/dL (ref 6–24)
Bilirubin Total: 0.6 mg/dL (ref 0.0–1.2)
CO2: 21 mmol/L (ref 20–29)
Calcium: 9.9 mg/dL (ref 8.7–10.2)
Chloride: 104 mmol/L (ref 96–106)
Creatinine, Ser: 1.21 mg/dL (ref 0.76–1.27)
GFR calc Af Amer: 77 mL/min/{1.73_m2} (ref >59)
GFR calc non Af Amer: 67 mL/min/{1.73_m2} (ref >59)
Globulin, Total: 2 g/dL (ref 1.5–4.5)
Glucose: 95 mg/dL (ref 65–99)
Potassium: 4.4 mmol/L (ref 3.5–5.2)
Sodium: 141 mmol/L (ref 134–144)
Total Protein: 7.2 g/dL (ref 6.0–8.5)

## 2020-03-11 LAB — HEMOGLOBIN A1C
Est. average glucose Bld gHb Est-mCnc: 120 mg/dL
Hgb A1c MFr Bld: 5.8 % — ABNORMAL HIGH (ref 4.8–5.6)

## 2020-03-23 ENCOUNTER — Encounter: Payer: Self-pay | Admitting: Registered Nurse

## 2020-04-12 ENCOUNTER — Encounter: Payer: Self-pay | Admitting: Registered Nurse

## 2020-04-12 NOTE — Telephone Encounter (Signed)
Pt is concerned his montelukast is causing some mood swings and some generalized anxiety. Is there something else he can be changed to ?

## 2020-05-03 ENCOUNTER — Other Ambulatory Visit: Payer: Self-pay | Admitting: Registered Nurse

## 2020-05-03 DIAGNOSIS — J4520 Mild intermittent asthma, uncomplicated: Secondary | ICD-10-CM

## 2020-05-03 NOTE — Telephone Encounter (Signed)
We have talked about this, so I am just sending this to you. I will take it out of the basket. thanks

## 2020-05-07 ENCOUNTER — Encounter: Payer: Self-pay | Admitting: Registered Nurse

## 2020-05-22 ENCOUNTER — Encounter: Payer: Self-pay | Admitting: Registered Nurse

## 2020-05-23 ENCOUNTER — Ambulatory Visit: Payer: 59 | Admitting: Registered Nurse

## 2020-05-24 ENCOUNTER — Encounter: Payer: Self-pay | Admitting: Registered Nurse

## 2020-07-14 ENCOUNTER — Encounter: Payer: 59 | Admitting: Registered Nurse

## 2020-07-28 ENCOUNTER — Other Ambulatory Visit: Payer: Self-pay | Admitting: Family Medicine

## 2020-07-28 DIAGNOSIS — J309 Allergic rhinitis, unspecified: Secondary | ICD-10-CM

## 2020-07-28 NOTE — Telephone Encounter (Signed)
Requested medication (s) are due for refill today: Yes  Requested medication (s) are on the active medication list: Yes  Last refill:  12/27/19  Future visit scheduled: No  Notes to clinic:  Pharmacy asking for diagnosis code.    Requested Prescriptions  Pending Prescriptions Disp Refills   fluticasone (FLONASE) 50 MCG/ACT nasal spray [Pharmacy Med Name: FLUTICASONE PROP 50 MCG SPRAY] 48 mL 2    Sig: SPRAY 2 SPRAYS INTO EACH NOSTRIL EVERY DAY      Ear, Nose, and Throat: Nasal Preparations - Corticosteroids Passed - 07/28/2020  9:11 AM      Passed - Valid encounter within last 12 months    Recent Outpatient Visits           4 months ago History of elevated glucose   [facility], NP   7 months ago Mild intermittent asthma without complication   [facility], Ranell Patrick, MD   1 year ago Mild intermittent asthma without complication   [facility], Ranell Patrick, MD   1 year ago Left cervical radiculopathy   [facility], Ranell Patrick, MD   1 year ago Essential hypertension   [facility], Ranell Patrick, MD

## 2020-12-29 ENCOUNTER — Other Ambulatory Visit: Payer: Self-pay | Admitting: Family Medicine

## 2020-12-29 DIAGNOSIS — E785 Hyperlipidemia, unspecified: Secondary | ICD-10-CM

## 2021-03-01 ENCOUNTER — Other Ambulatory Visit: Payer: Self-pay | Admitting: Registered Nurse

## 2021-03-01 DIAGNOSIS — E785 Hyperlipidemia, unspecified: Secondary | ICD-10-CM

## 2021-03-01 DIAGNOSIS — J309 Allergic rhinitis, unspecified: Secondary | ICD-10-CM

## 2021-06-20 ENCOUNTER — Other Ambulatory Visit: Payer: Self-pay | Admitting: Registered Nurse

## 2021-06-20 DIAGNOSIS — E785 Hyperlipidemia, unspecified: Secondary | ICD-10-CM

## 2022-09-14 ENCOUNTER — Other Ambulatory Visit: Payer: Self-pay

## 2022-09-14 ENCOUNTER — Emergency Department (HOSPITAL_COMMUNITY)
Admission: EM | Admit: 2022-09-14 | Discharge: 2022-09-14 | Disposition: A | Discharge status: Home / Self Care | Payer: No Typology Code available for payment source | Attending: Emergency Medicine | Admitting: Emergency Medicine

## 2022-09-14 ENCOUNTER — Encounter (HOSPITAL_COMMUNITY): Payer: Self-pay | Admitting: Emergency Medicine

## 2022-09-14 DIAGNOSIS — W44B9XA Other plastic object entering into or through a natural orifice, initial encounter: Secondary | ICD-10-CM | POA: Diagnosis not present

## 2022-09-14 DIAGNOSIS — T161XXA Foreign body in right ear, initial encounter: Secondary | ICD-10-CM | POA: Insufficient documentation

## 2022-09-14 DIAGNOSIS — Z79899 Other long term (current) drug therapy: Secondary | ICD-10-CM | POA: Diagnosis not present

## 2022-09-14 DIAGNOSIS — H60501 Unspecified acute noninfective otitis externa, right ear: Secondary | ICD-10-CM | POA: Diagnosis not present

## 2022-09-14 MED ORDER — LIDOCAINE VISCOUS HCL 2 % SOLUTION FOR USE IN EAR (ED/BUG EXTRACTION)
15.0000 mL | Freq: Once | OROMUCOSAL | Status: AC
  Administered 2022-09-14: 15 mL via OTIC
  Filled 2022-09-14: qty 15

## 2022-09-14 MED ORDER — CIPROFLOXACIN-DEXAMETHASONE 0.3-0.1 % OT SUSP
4.0000 [drp] | Freq: Once | OTIC | Status: AC
  Administered 2022-09-14: 4 [drp] via OTIC
  Filled 2022-09-14: qty 7.5

## 2022-09-14 NOTE — ED Triage Notes (Signed)
Pt states that he thinks he has earwax stuck in his ear but is unsure. States this has been going on for 4.5 weeks, went to UC to have it removed but was unable to tolerate it. Was told to follow up with ENT, but has be unable to get an appointment

## 2022-09-14 NOTE — ED Provider Notes (Signed)
[place] DEPT Provider Note   CSN: [id number] Arrival date & time: 09/14/22  0620     History  Chief Complaint  Patient presents with   Foreign Body in [place] is a 56 y.o. male.  Patient presents to the ED with R ear pain. States he has foreign body in ear canal for 1.5 months. Was seen at urgent care and told that he had an impaction that was hard and not wax. He has had some muffled hearing. No fever, N/V. He has tried to call ENT but has not been able to get an appointment. Unsure what could be in ear, but does state he has used ear candle.        Home Medications Prior to Admission medications   Medication Sig Start Date End Date Taking? Authorizing Provider  ADVAIR DISKUS 250-50 MCG/DOSE AEPB INHALE 1 PUFF INTO THE LUNGS IN THE MORNING AND AT BEDTIME. 02/21/20   Wendie Agreste, MD  albuterol (VENTOLIN HFA) 108 (90 Base) MCG/ACT inhaler TAKE 2 PUFFS BY MOUTH EVERY 4 HOURS AS NEEDED 12/18/19   Wendie Agreste, MD  amLODipine (NORVASC) 5 MG tablet Take 1 tablet (5 mg total) by mouth daily. 12/27/19   Wendie Agreste, MD  atorvastatin (LIPITOR) 10 MG tablet TAKE 1 TABLET BY MOUTH EVERY DAY 03/01/21   Maximiano Coss, NP  cetirizine (ZYRTEC) 10 MG chewable tablet Chew 10 mg by mouth daily. Reported on 12/22/2015    [provider]  Dextromethorphan-guaiFENesin (MUCINEX DM MAXIMUM STRENGTH) 60-1200 MG TB12 Take by mouth.    [provider]  fluticasone (FLONASE) 50 MCG/ACT nasal spray SPRAY 2 SPRAYS INTO EACH NOSTRIL EVERY DAY 03/01/21   Maximiano Coss, NP  montelukast (SINGULAIR) 10 MG tablet Take 1 tablet (10 mg total) by mouth at bedtime. 03/10/20   Maximiano Coss, NP  Tiotropium Bromide Monohydrate (SPIRIVA RESPIMAT) 2.5 MCG/ACT AERS Inhale 2 puffs into the lungs daily. 03/10/20   Maximiano Coss, NP      Allergies    Tramadol    Review of Systems   Review of Systems  Physical Exam Updated Vital  Signs BP (!) 188/102 (BP Location: Right Arm)   Pulse 87   Temp (!) 96.2 F (35.7 C)   Resp 18   Ht '5\' 11"'$  (1.803 m)   Wt 99.8 kg   SpO2 96%   BMI 30.68 kg/m  Physical Exam Vitals and nursing note reviewed.  Constitutional:      Appearance: He is well-developed.  HENT:     Head: Normocephalic and atraumatic.     Right Ear: Decreased hearing noted.     Left Ear: Tympanic membrane and ear canal normal. Tympanic membrane is not erythematous.     Ears:     Comments: R ear canal foreign body, light yellow, columnar in shape, rubber texture Eyes:     Conjunctiva/sclera: Conjunctivae normal.  Pulmonary:     Effort: No respiratory distress.  Musculoskeletal:     Cervical back: Normal range of motion and neck supple.  Skin:    General: Skin is warm and dry.  Neurological:     Mental Status: He is alert.     ED Results / Procedures / Treatments   Labs (all labs ordered are listed, but only abnormal results are displayed) Labs Reviewed - No data to display  EKG None  Radiology No results found.  Procedures .Foreign Body Removal  Date/Time: 09/14/2022 10:56 AM  Performed  by: Carlisle Cater, PA-C Authorized by: Carlisle Cater, PA-C  Consent: Verbal consent obtained. Consent given by: patient Patient identity confirmed: verbally with patient and provided demographic data Body area: ear Location details: right ear Anesthesia: see MAR for details  Anesthesia: Local Anesthetic: topical anesthetic (viscous lidocaine) Localization method: ENT speculum Removal mechanism: alligator forceps Complexity: simple 1 objects recovered. Objects recovered: 1/2" long plastic ear wick base Post-procedure assessment: foreign body removed      Medications Ordered in ED Medications  lidocaine (XYLOCAINE) viscous 2% for use in ear (bug extraction) (has no administration in time range)    ED Course/ Medical Decision Making/ A&P    Patient seen and examined. History obtained  directly from patient.   Labs/EKG: None ordered  Imaging: None ordered  Medications/Fluids: Viscous lidocaine  Most recent vital signs reviewed and are as follows: BP (!) 188/102 (BP Location: Right Arm)   Pulse 87   Temp (!) 96.2 F (35.7 C)   Resp 18   Ht '5\' 11"'$  (1.803 m)   Wt 99.8 kg   SpO2 96%   BMI 30.68 kg/m   Initial impression: R ear canal foreign body  10:56 AM Reassessment performed. Patient appears stable after procedure.  Foreign body successfully removed.  Ear exam after removal shows trauma to the anterior portion of the ear canal with minor bleeding, swelling of the ear canal diffusely consistent with otitis externa, dark but not erythematous TM.  Patient continues to complain of some muffled hearing, but states that the pressure in the canal was much better.  Plan: Discharge to home.   Prescriptions written for: Provided Ciprodex with instructions  Other home care instructions discussed: Avoidance of additional trauma  ED return instructions discussed: Return with fever or swelling  Follow-up instructions discussed: Discussed that if symptoms are persistent in regards to hearing, pain, swelling or drainage from the ear, patient needs to follow-up with PCP and/or ENT referral in the next 5 days.                             Medical Decision Making Risk Prescription drug management.   Foreign body right ear canal, removed.  Patient has signs of otitis externa likely from foreign body in there for nearly 2 months.  No sign of significant infection or cellulitis at this time.  No signs of TM perforation.        Final Clinical Impression(s) / ED Diagnoses Final diagnoses:  Foreign body of right ear, initial encounter  Acute otitis externa of right ear, unspecified type    Rx / DC Orders ED Discharge Orders     None         Carlisle Cater, PA-C 09/14/22 1102    Audley Hose, MD 09/14/22 1152

## 2022-09-14 NOTE — Discharge Instructions (Signed)
Please read and follow all provided instructions.  Your diagnoses today include:  1. Foreign body of right ear, initial encounter    Tests performed today include: Vital signs. See below for your results today.   Medications prescribed:  Ciprodex ear drops - instill 4 drops into affected ear twice daily for 7 days  Take any prescribed medications only as directed.  Home care instructions:  Follow any educational materials contained in this packet.  BE VERY CAREFUL not to take multiple medicines containing Tylenol (also called acetaminophen). Doing so can lead to an overdose which can damage your liver and cause liver failure and possibly death.   Follow-up instructions: Please follow-up with your primary care provider or the your doctor in 5 days for reexam if you have any pain, persistent hearing changes, drainage from the ear or swelling.  Return instructions:  Please return to the Emergency Department if you experience worsening symptoms.  Return with worsening swelling or fever. Please return if you have any other emergent concerns.  Additional Information:  Your vital signs today were: BP (!) 188/102 (BP Location: Right Arm)   Pulse 87   Temp (!) 96.2 F (35.7 C)   Resp 18   Ht '5\' 11"'$  (1.803 m)   Wt 99.8 kg   SpO2 96%   BMI 30.68 kg/m  If your blood pressure (BP) was elevated above 135/85 this visit, please have this repeated by your doctor within one month. --------------

## 2022-09-14 NOTE — ED Provider Triage Note (Signed)
Emergency Medicine Provider Triage Evaluation Note  Joel Salas , a 56 y.o. male  was evaluated in triage. Patient presents to the ED with R ear pain. States he has foreign body in ear canal for 1.5 months. Was seen at urgent care and told that he had an impaction that was hard and not wax. He has had some muffled hearing. No fever, N/V. He has tried to call ENT but has not been able to get an appointment. Unsure what could be in ear, but does state he has used ear candle.    Review of Systems  Positive: Ear canal FB Negative: fever  Physical Exam  BP (!) 188/102 (BP Location: Right Arm)   Pulse 87   Temp (!) 96.2 F (35.7 C)   Resp 18   Ht '5\' 11"'$  (1.803 m)   Wt 99.8 kg   SpO2 96%   BMI 30.68 kg/m  Gen:   Awake, no distress   Resp:  Normal effort  MSK:   Moves extremities without difficulty  Other:  R ear canal FB: rubbery, cylindrical, light yellow. Pt has pain with movement. No active drainage. Mild mastoid tenderness without overlying redness or warmth.    Medical Decision Making  Medically screening exam initiated at 7:17 AM.  Appropriate orders placed.  Astrid Divine Lemelle was informed that the remainder of the evaluation will be completed by another provider, this initial triage assessment does not replace that evaluation, and the importance of remaining in the ED until their evaluation is complete.  Object very hard to grasp with alligator forceps and patient is very sensitive to any movement of the object, pulling his head away reflexively at times. Will try to place some viscous lidocaine into canal to see if this facilitates removal.    Carlisle Cater, PA-C 09/14/22 0720

## 2024-03-06 ENCOUNTER — Ambulatory Visit (HOSPITAL_COMMUNITY): Payer: Self-pay
